# Patient Record
Sex: Female | Born: 1945 | Race: White | Hispanic: No | Marital: Married | Smoking: Never smoker
Health system: Southern US, Community
[De-identification: ages and names within clinical notes are randomized; demographics above are authoritative.]

## PROBLEM LIST (undated history)

## (undated) DIAGNOSIS — E1140 Type 2 diabetes mellitus with diabetic neuropathy, unspecified: Secondary | ICD-10-CM

## (undated) DIAGNOSIS — K5900 Constipation, unspecified: Secondary | ICD-10-CM

## (undated) DIAGNOSIS — R5383 Other fatigue: Secondary | ICD-10-CM

## (undated) DIAGNOSIS — E669 Obesity, unspecified: Secondary | ICD-10-CM

## (undated) DIAGNOSIS — E119 Type 2 diabetes mellitus without complications: Secondary | ICD-10-CM

## (undated) DIAGNOSIS — R635 Abnormal weight gain: Secondary | ICD-10-CM

## (undated) DIAGNOSIS — G8929 Other chronic pain: Secondary | ICD-10-CM

## (undated) DIAGNOSIS — K5792 Diverticulitis of intestine, part unspecified, without perforation or abscess without bleeding: Secondary | ICD-10-CM

## (undated) DIAGNOSIS — F32A Depression, unspecified: Secondary | ICD-10-CM

## (undated) DIAGNOSIS — E785 Hyperlipidemia, unspecified: Secondary | ICD-10-CM

## (undated) DIAGNOSIS — R5381 Other malaise: Secondary | ICD-10-CM

## (undated) DIAGNOSIS — M810 Age-related osteoporosis without current pathological fracture: Secondary | ICD-10-CM

## (undated) DIAGNOSIS — M545 Other chronic pain: Secondary | ICD-10-CM

## (undated) DIAGNOSIS — R609 Edema, unspecified: Secondary | ICD-10-CM

## (undated) DIAGNOSIS — J45909 Unspecified asthma, uncomplicated: Secondary | ICD-10-CM

## (undated) DIAGNOSIS — I10 Essential (primary) hypertension: Secondary | ICD-10-CM

## (undated) DIAGNOSIS — R21 Rash and other nonspecific skin eruption: Secondary | ICD-10-CM

## (undated) DIAGNOSIS — M1990 Unspecified osteoarthritis, unspecified site: Secondary | ICD-10-CM

## (undated) DIAGNOSIS — F419 Anxiety disorder, unspecified: Secondary | ICD-10-CM

## (undated) DIAGNOSIS — E559 Vitamin D deficiency, unspecified: Secondary | ICD-10-CM

## (undated) DIAGNOSIS — Z780 Asymptomatic menopausal state: Secondary | ICD-10-CM

## (undated) DIAGNOSIS — K219 Gastro-esophageal reflux disease without esophagitis: Secondary | ICD-10-CM

## (undated) DIAGNOSIS — M797 Fibromyalgia: Secondary | ICD-10-CM

## (undated) HISTORY — DX: Type 2 diabetes mellitus with diabetic neuropathy, unspecified: E11.40

## (undated) HISTORY — DX: Anxiety disorder, unspecified: F41.9

## (undated) HISTORY — PX: COLONOSCOPY: SHX174

## (undated) HISTORY — DX: Abnormal weight gain: R63.5

## (undated) HISTORY — DX: Gastro-esophageal reflux disease without esophagitis: K21.9

## (undated) HISTORY — DX: Rash and other nonspecific skin eruption: R21

## (undated) HISTORY — DX: Other malaise: R53.83

## (undated) HISTORY — PX: ANKLE FUSION: SHX881

## (undated) HISTORY — DX: Diverticulitis of intestine, part unspecified, without perforation or abscess without bleeding: K57.92

## (undated) HISTORY — DX: Other malaise: R53.81

## (undated) HISTORY — DX: Age-related osteoporosis without current pathological fracture: M81.0

## (undated) HISTORY — PX: CHOLECYSTECTOMY: SHX55

## (undated) HISTORY — DX: Fibromyalgia: M79.7

## (undated) HISTORY — DX: Other chronic pain: G89.29

## (undated) HISTORY — DX: Hyperlipidemia, unspecified: E78.5

## (undated) HISTORY — DX: Edema, unspecified: R60.9

## (undated) HISTORY — DX: Obesity, unspecified: E66.9

## (undated) HISTORY — DX: Other chronic pain: M54.50

## (undated) HISTORY — DX: Unspecified osteoarthritis, unspecified site: M19.90

## (undated) HISTORY — DX: Depression, unspecified: F32.A

## (undated) HISTORY — DX: Asymptomatic menopausal state: Z78.0

## (undated) HISTORY — DX: Vitamin D deficiency, unspecified: E55.9

---

## 2003-01-05 ENCOUNTER — Encounter: Payer: Self-pay | Admitting: Rheumatology

## 2003-01-05 ENCOUNTER — Encounter: Admission: RE | Admit: 2003-01-05 | Discharge: 2003-01-05 | Payer: Self-pay | Admitting: Rheumatology

## 2012-10-13 ENCOUNTER — Other Ambulatory Visit: Payer: Self-pay | Admitting: Orthopaedic Surgery

## 2012-10-13 ENCOUNTER — Ambulatory Visit
Admission: RE | Admit: 2012-10-13 | Discharge: 2012-10-13 | Disposition: A | Discharge status: Home / Self Care | Payer: Medicare Other | Source: Ambulatory Visit | Attending: Orthopaedic Surgery | Admitting: Orthopaedic Surgery

## 2012-10-13 DIAGNOSIS — R609 Edema, unspecified: Secondary | ICD-10-CM

## 2014-12-12 ENCOUNTER — Other Ambulatory Visit: Payer: Self-pay | Admitting: Orthopaedic Surgery

## 2014-12-12 DIAGNOSIS — M545 Low back pain: Secondary | ICD-10-CM

## 2015-01-02 ENCOUNTER — Ambulatory Visit
Admission: RE | Admit: 2015-01-02 | Discharge: 2015-01-02 | Disposition: A | Discharge status: Home / Self Care | Payer: Commercial Managed Care - HMO | Source: Ambulatory Visit | Attending: Orthopaedic Surgery | Admitting: Orthopaedic Surgery

## 2015-01-02 DIAGNOSIS — M545 Low back pain: Secondary | ICD-10-CM

## 2015-10-28 DIAGNOSIS — R1012 Left upper quadrant pain: Secondary | ICD-10-CM | POA: Diagnosis not present

## 2015-10-28 DIAGNOSIS — K5909 Other constipation: Secondary | ICD-10-CM | POA: Diagnosis not present

## 2015-10-31 DIAGNOSIS — R1012 Left upper quadrant pain: Secondary | ICD-10-CM | POA: Diagnosis not present

## 2015-10-31 DIAGNOSIS — Z9049 Acquired absence of other specified parts of digestive tract: Secondary | ICD-10-CM | POA: Diagnosis not present

## 2015-11-03 DIAGNOSIS — N182 Chronic kidney disease, stage 2 (mild): Secondary | ICD-10-CM | POA: Diagnosis not present

## 2015-11-03 DIAGNOSIS — I129 Hypertensive chronic kidney disease with stage 1 through stage 4 chronic kidney disease, or unspecified chronic kidney disease: Secondary | ICD-10-CM | POA: Diagnosis not present

## 2015-11-03 DIAGNOSIS — E782 Mixed hyperlipidemia: Secondary | ICD-10-CM | POA: Diagnosis not present

## 2015-11-03 DIAGNOSIS — Z6841 Body Mass Index (BMI) 40.0 and over, adult: Secondary | ICD-10-CM | POA: Diagnosis not present

## 2015-11-03 DIAGNOSIS — E1169 Type 2 diabetes mellitus with other specified complication: Secondary | ICD-10-CM | POA: Diagnosis not present

## 2015-11-08 DIAGNOSIS — J069 Acute upper respiratory infection, unspecified: Secondary | ICD-10-CM | POA: Diagnosis not present

## 2015-11-13 DIAGNOSIS — M1711 Unilateral primary osteoarthritis, right knee: Secondary | ICD-10-CM | POA: Diagnosis not present

## 2015-11-13 DIAGNOSIS — M25561 Pain in right knee: Secondary | ICD-10-CM | POA: Diagnosis not present

## 2015-11-13 DIAGNOSIS — G8929 Other chronic pain: Secondary | ICD-10-CM | POA: Diagnosis not present

## 2015-11-13 DIAGNOSIS — M1712 Unilateral primary osteoarthritis, left knee: Secondary | ICD-10-CM | POA: Diagnosis not present

## 2015-11-13 DIAGNOSIS — M25562 Pain in left knee: Secondary | ICD-10-CM | POA: Diagnosis not present

## 2015-11-20 DIAGNOSIS — Z6841 Body Mass Index (BMI) 40.0 and over, adult: Secondary | ICD-10-CM | POA: Diagnosis not present

## 2015-11-20 DIAGNOSIS — I129 Hypertensive chronic kidney disease with stage 1 through stage 4 chronic kidney disease, or unspecified chronic kidney disease: Secondary | ICD-10-CM | POA: Diagnosis not present

## 2015-11-20 DIAGNOSIS — L659 Nonscarring hair loss, unspecified: Secondary | ICD-10-CM | POA: Diagnosis not present

## 2015-11-20 DIAGNOSIS — E1165 Type 2 diabetes mellitus with hyperglycemia: Secondary | ICD-10-CM | POA: Diagnosis not present

## 2015-11-20 DIAGNOSIS — E1140 Type 2 diabetes mellitus with diabetic neuropathy, unspecified: Secondary | ICD-10-CM | POA: Diagnosis not present

## 2015-11-20 DIAGNOSIS — N182 Chronic kidney disease, stage 2 (mild): Secondary | ICD-10-CM | POA: Diagnosis not present

## 2015-12-09 DIAGNOSIS — E1140 Type 2 diabetes mellitus with diabetic neuropathy, unspecified: Secondary | ICD-10-CM | POA: Diagnosis not present

## 2015-12-09 DIAGNOSIS — E1165 Type 2 diabetes mellitus with hyperglycemia: Secondary | ICD-10-CM | POA: Diagnosis not present

## 2015-12-09 DIAGNOSIS — Z7189 Other specified counseling: Secondary | ICD-10-CM | POA: Diagnosis not present

## 2015-12-09 DIAGNOSIS — I129 Hypertensive chronic kidney disease with stage 1 through stage 4 chronic kidney disease, or unspecified chronic kidney disease: Secondary | ICD-10-CM | POA: Diagnosis not present

## 2015-12-09 DIAGNOSIS — N182 Chronic kidney disease, stage 2 (mild): Secondary | ICD-10-CM | POA: Diagnosis not present

## 2015-12-25 DIAGNOSIS — M25569 Pain in unspecified knee: Secondary | ICD-10-CM

## 2015-12-25 DIAGNOSIS — M1711 Unilateral primary osteoarthritis, right knee: Secondary | ICD-10-CM | POA: Diagnosis not present

## 2015-12-25 HISTORY — DX: Pain in unspecified knee: M25.569

## 2016-01-13 DIAGNOSIS — I129 Hypertensive chronic kidney disease with stage 1 through stage 4 chronic kidney disease, or unspecified chronic kidney disease: Secondary | ICD-10-CM | POA: Diagnosis not present

## 2016-01-13 DIAGNOSIS — E1169 Type 2 diabetes mellitus with other specified complication: Secondary | ICD-10-CM | POA: Diagnosis not present

## 2016-01-13 DIAGNOSIS — E782 Mixed hyperlipidemia: Secondary | ICD-10-CM | POA: Diagnosis not present

## 2016-01-15 DIAGNOSIS — M797 Fibromyalgia: Secondary | ICD-10-CM | POA: Diagnosis not present

## 2016-01-15 DIAGNOSIS — N182 Chronic kidney disease, stage 2 (mild): Secondary | ICD-10-CM | POA: Diagnosis not present

## 2016-01-15 DIAGNOSIS — I129 Hypertensive chronic kidney disease with stage 1 through stage 4 chronic kidney disease, or unspecified chronic kidney disease: Secondary | ICD-10-CM | POA: Diagnosis not present

## 2016-01-26 DIAGNOSIS — Z9181 History of falling: Secondary | ICD-10-CM | POA: Diagnosis not present

## 2016-01-26 DIAGNOSIS — E1165 Type 2 diabetes mellitus with hyperglycemia: Secondary | ICD-10-CM | POA: Diagnosis not present

## 2016-01-26 DIAGNOSIS — E1140 Type 2 diabetes mellitus with diabetic neuropathy, unspecified: Secondary | ICD-10-CM | POA: Diagnosis not present

## 2016-01-26 DIAGNOSIS — Z139 Encounter for screening, unspecified: Secondary | ICD-10-CM | POA: Diagnosis not present

## 2016-01-26 DIAGNOSIS — Z0000 Encounter for general adult medical examination without abnormal findings: Secondary | ICD-10-CM | POA: Diagnosis not present

## 2016-01-26 DIAGNOSIS — Z1389 Encounter for screening for other disorder: Secondary | ICD-10-CM | POA: Diagnosis not present

## 2016-01-29 DIAGNOSIS — M1711 Unilateral primary osteoarthritis, right knee: Secondary | ICD-10-CM | POA: Diagnosis not present

## 2016-02-01 ENCOUNTER — Other Ambulatory Visit: Payer: Self-pay | Admitting: Orthopedic Surgery

## 2016-02-25 ENCOUNTER — Encounter (HOSPITAL_COMMUNITY): Payer: Self-pay

## 2016-02-25 ENCOUNTER — Ambulatory Visit (HOSPITAL_COMMUNITY)
Admission: RE | Admit: 2016-02-25 | Discharge: 2016-02-25 | Disposition: A | Discharge status: Home / Self Care | Payer: Medicare Other | Source: Ambulatory Visit | Attending: Orthopedic Surgery | Admitting: Orthopedic Surgery

## 2016-02-25 ENCOUNTER — Encounter (HOSPITAL_COMMUNITY)
Admission: RE | Admit: 2016-02-25 | Discharge: 2016-02-25 | Disposition: A | Discharge status: Home / Self Care | Payer: Medicare Other | Source: Ambulatory Visit | Attending: Orthopedic Surgery | Admitting: Orthopedic Surgery

## 2016-02-25 ENCOUNTER — Other Ambulatory Visit: Payer: Self-pay

## 2016-02-25 DIAGNOSIS — Z01812 Encounter for preprocedural laboratory examination: Secondary | ICD-10-CM | POA: Diagnosis not present

## 2016-02-25 DIAGNOSIS — Z01810 Encounter for preprocedural cardiovascular examination: Secondary | ICD-10-CM | POA: Diagnosis not present

## 2016-02-25 DIAGNOSIS — Z01818 Encounter for other preprocedural examination: Secondary | ICD-10-CM

## 2016-02-25 HISTORY — DX: Essential (primary) hypertension: I10

## 2016-02-25 HISTORY — DX: Unspecified asthma, uncomplicated: J45.909

## 2016-02-25 HISTORY — DX: Constipation, unspecified: K59.00

## 2016-02-25 HISTORY — DX: Type 2 diabetes mellitus without complications: E11.9

## 2016-02-25 LAB — CBC WITH DIFFERENTIAL/PLATELET
Basophils Absolute: 0 10*3/uL (ref 0.0–0.1)
Basophils Relative: 0 %
Eosinophils Absolute: 0.2 10*3/uL (ref 0.0–0.7)
Eosinophils Relative: 2 %
HEMATOCRIT: 43.1 % (ref 36.0–46.0)
HEMOGLOBIN: 13.7 g/dL (ref 12.0–15.0)
LYMPHS ABS: 3 10*3/uL (ref 0.7–4.0)
LYMPHS PCT: 32 %
MCH: 26.4 pg (ref 26.0–34.0)
MCHC: 31.8 g/dL (ref 30.0–36.0)
MCV: 83 fL (ref 78.0–100.0)
Monocytes Absolute: 0.6 10*3/uL (ref 0.1–1.0)
Monocytes Relative: 6 %
NEUTROS ABS: 5.6 10*3/uL (ref 1.7–7.7)
Neutrophils Relative %: 60 %
Platelets: 299 10*3/uL (ref 150–400)
RBC: 5.19 MIL/uL — AB (ref 3.87–5.11)
RDW: 14.8 % (ref 11.5–15.5)
WBC: 9.4 10*3/uL (ref 4.0–10.5)

## 2016-02-25 LAB — APTT: APTT: 31 s (ref 24–37)

## 2016-02-25 LAB — URINALYSIS, ROUTINE W REFLEX MICROSCOPIC
Bilirubin Urine: NEGATIVE
Glucose, UA: NEGATIVE mg/dL
KETONES UR: NEGATIVE mg/dL
LEUKOCYTES UA: NEGATIVE
Nitrite: NEGATIVE
PH: 6.5 (ref 5.0–8.0)
Protein, ur: NEGATIVE mg/dL
Specific Gravity, Urine: 1.008 (ref 1.005–1.030)

## 2016-02-25 LAB — COMPREHENSIVE METABOLIC PANEL
ALK PHOS: 65 U/L (ref 38–126)
ALT: 24 U/L (ref 14–54)
AST: 27 U/L (ref 15–41)
Albumin: 3.7 g/dL (ref 3.5–5.0)
Anion gap: 8 (ref 5–15)
BILIRUBIN TOTAL: 0.4 mg/dL (ref 0.3–1.2)
BUN: 12 mg/dL (ref 6–20)
CALCIUM: 9.5 mg/dL (ref 8.9–10.3)
CHLORIDE: 102 mmol/L (ref 101–111)
CO2: 25 mmol/L (ref 22–32)
CREATININE: 0.6 mg/dL (ref 0.44–1.00)
GFR calc Af Amer: >60 mL/min (ref >60)
Glucose, Bld: 193 mg/dL — ABNORMAL HIGH (ref 65–99)
Potassium: 4 mmol/L (ref 3.5–5.1)
Sodium: 135 mmol/L (ref 135–145)
TOTAL PROTEIN: 6.9 g/dL (ref 6.5–8.1)

## 2016-02-25 LAB — PROTIME-INR
INR: 0.98 (ref 0.00–1.49)
PROTHROMBIN TIME: 13.2 s (ref 11.6–15.2)

## 2016-02-25 LAB — URINE MICROSCOPIC-ADD ON

## 2016-02-25 LAB — GLUCOSE, CAPILLARY: Glucose-Capillary: 179 mg/dL — ABNORMAL HIGH (ref 65–99)

## 2016-02-25 LAB — SURGICAL PCR SCREEN
MRSA, PCR: NEGATIVE
Staphylococcus aureus: NEGATIVE

## 2016-02-25 NOTE — Progress Notes (Signed)
PCP is Marco Collie  Patient denied having any acute cardiac or pulmonary issues  Patient informed Nurse that she does not check her blood glucose levels at home. CBG on arrival to PAT was 179, and patient stated she consumed three pancakes and drank two or three cups of coffee.  Patient stated she last used her Albuterol inhaler last week.

## 2016-02-25 NOTE — Pre-Procedure Instructions (Signed)
Tylar Ollivierre  02/25/2016     Your procedure is scheduled on : Monday March 08, 2016 at 10:00 AM.  Report to [facility] Admitting at 8:00 AM.  Call this number if you have problems the morning of surgery: [phone]    Remember:  Do not eat food or drink liquids after midnight.  Take these medicines the morning of surgery with A SIP OF WATER : Albuterol inhaler if needed (bring inhaler with you), Omeprazole (Prilosec), and Tramadol (Ultram) if needed   Stop taking any vitamins, herbal medications/supplements, NSAIDs, Ibuprofen, Advil, Motrin, Aleve etc on Monday May 29th   Do NOT take any diabetic medications the morning of your surgery (NO Metformin/Glucophage)    How to Manage Your Diabetes Before and After Surgery  Why is it important to control my blood sugar before and after surgery? . Improving blood sugar levels before and after surgery helps healing and can limit problems. . A way of improving blood sugar control is eating a healthy diet by: o  Eating less sugar and carbohydrates o  Increasing activity/exercise o  Talking with your doctor about reaching your blood sugar goals . High blood sugars (greater than 180 mg/dL) can raise your risk of infections and slow your recovery, so you will need to focus on controlling your diabetes during the weeks before surgery. . Make sure that the doctor who takes care of your diabetes knows about your planned surgery including the date and location.  How do I manage my blood sugar before surgery? . Check your blood sugar at least 4 times a day, starting 2 days before surgery, to make sure that the level is not too high or low. o Check your blood sugar the morning of your surgery when you wake up and every 2 hours until you get to the Short Stay unit. . If your blood sugar is less than 70 mg/dL, you will need to treat for low blood sugar: o Do not take insulin. o Treat a low blood sugar (less than 70 mg/dL) with  cup of  clear juice (cranberry or apple), 4 glucose tablets, OR glucose gel. o Recheck blood sugar in 15 minutes after treatment (to make sure it is greater than 70 mg/dL). If your blood sugar is not greater than 70 mg/dL on recheck, call [phone] for further instructions. . Report your blood sugar to the short stay nurse when you get to Short Stay.  . If you are admitted to the hospital after surgery: o Your blood sugar will be checked by the staff and you will probably be given insulin after surgery (instead of oral diabetes medicines) to make sure you have good blood sugar levels. o The goal for blood sugar control after surgery is 80-180 mg/dL.     WHAT DO I DO ABOUT MY DIABETES MEDICATION?  Marland Kitchen Do not take oral diabetes medicines (pills) the morning of surgery.  Reviewed and Endorsed by [facility] Patient Education Committee, August 2015   Do not wear jewelry, make-up or nail polish.  Do not wear lotions, powders, or perfumes.    Do not shave 48 hours prior to surgery.   Do not bring valuables to the hospital.  [facility] is not responsible for any belongings or valuables.  Contacts, dentures or bridgework may not be worn into surgery.  Leave your suitcase in the car.  After surgery it may be brought to your room.  For patients admitted to the hospital, discharge time will be  determined by your treatment team.  Patients discharged the day of surgery will not be allowed to drive home.   Name and phone number of your driver:    Special instructions:  Shower using CHG soap the night before and the morning of your surgery  Please read over the following fact sheets that you were given. Pain Booklet, MRSA Information and Surgical Site Infection Prevention

## 2016-02-26 LAB — HEMOGLOBIN A1C
Hgb A1c MFr Bld: 6.5 % — ABNORMAL HIGH (ref 4.8–5.6)
Mean Plasma Glucose: 140 mg/dL

## 2016-02-26 LAB — URINE CULTURE

## 2016-03-05 MED ORDER — SODIUM CHLORIDE 0.9 % IV SOLN: INTRAVENOUS | Status: DC

## 2016-03-05 MED ORDER — CEFAZOLIN SODIUM-DEXTROSE 2-4 GM/100ML-% IV SOLN
2.0000 g | INTRAVENOUS | Status: AC
  Administered 2016-03-08: 2 g via INTRAVENOUS
  Filled 2016-03-05: qty 100

## 2016-03-05 MED ORDER — TRANEXAMIC ACID 1000 MG/10ML IV SOLN
1000.0000 mg | INTRAVENOUS | Status: AC
  Administered 2016-03-08: 1000 mg via INTRAVENOUS
  Filled 2016-03-05: qty 10

## 2016-03-05 MED ORDER — ACETAMINOPHEN 500 MG PO TABS
1000.0000 mg | ORAL_TABLET | Freq: Once | ORAL | Status: AC
  Administered 2016-03-08: 1000 mg via ORAL
  Filled 2016-03-05: qty 2

## 2016-03-07 NOTE — Anesthesia Preprocedure Evaluation (Addendum)
Anesthesia Evaluation  Patient identified by MRN, date of birth, ID band Patient awake    Reviewed: Allergy & Precautions, H&P , NPO status , Patient's Chart, lab work & pertinent test results  Airway Mallampati: III  TM Distance: >3 FB Neck ROM: Full    Dental no notable dental hx. (+) Teeth Intact, Dental Advisory Given   Pulmonary asthma ,    Pulmonary exam normal breath sounds clear to auscultation       Cardiovascular hypertension, Pt. on medications  Rhythm:Regular Rate:Normal     Neuro/Psych negative neurological ROS  negative psych ROS   GI/Hepatic negative GI ROS, Neg liver ROS,   Endo/Other  diabetes, Type 2, Oral Hypoglycemic AgentsMorbid obesity  Renal/GU negative Renal ROS  negative genitourinary   Musculoskeletal   Abdominal   Peds  Hematology negative hematology ROS (+)   Anesthesia Other Findings   Reproductive/Obstetrics negative OB ROS                           Anesthesia Physical Anesthesia Plan  ASA: III  Anesthesia Plan: MAC and Spinal   Post-op Pain Management:    Induction: Intravenous  Airway Management Planned: Simple Face Mask  Additional Equipment:   Intra-op Plan:   Post-operative Plan:   Informed Consent: I have reviewed the patients History and Physical, chart, labs and discussed the procedure including the risks, benefits and alternatives for the proposed anesthesia with the patient or authorized representative who has indicated his/her understanding and acceptance.   Dental advisory given  Plan Discussed with: CRNA  Anesthesia Plan Comments:         Anesthesia Quick Evaluation

## 2016-03-08 ENCOUNTER — Encounter (HOSPITAL_COMMUNITY)
Admission: RE | Disposition: A | Discharge status: Home Health | Payer: Self-pay | Source: Ambulatory Visit | Attending: Orthopedic Surgery

## 2016-03-08 ENCOUNTER — Inpatient Hospital Stay (HOSPITAL_COMMUNITY): Payer: Medicare Other | Admitting: Anesthesiology

## 2016-03-08 ENCOUNTER — Encounter (HOSPITAL_COMMUNITY): Payer: Self-pay | Admitting: *Deleted

## 2016-03-08 ENCOUNTER — Inpatient Hospital Stay (HOSPITAL_COMMUNITY)
Admission: RE | Admit: 2016-03-08 | Discharge: 2016-03-10 | DRG: 470 | Disposition: A | Discharge status: Home Health | Payer: Medicare Other | Source: Ambulatory Visit | Attending: Orthopedic Surgery | Admitting: Orthopedic Surgery

## 2016-03-08 DIAGNOSIS — Z96651 Presence of right artificial knee joint: Secondary | ICD-10-CM | POA: Diagnosis not present

## 2016-03-08 DIAGNOSIS — E119 Type 2 diabetes mellitus without complications: Secondary | ICD-10-CM | POA: Diagnosis not present

## 2016-03-08 DIAGNOSIS — I10 Essential (primary) hypertension: Secondary | ICD-10-CM | POA: Diagnosis not present

## 2016-03-08 DIAGNOSIS — M1711 Unilateral primary osteoarthritis, right knee: Principal | ICD-10-CM | POA: Diagnosis present

## 2016-03-08 DIAGNOSIS — Z885 Allergy status to narcotic agent status: Secondary | ICD-10-CM

## 2016-03-08 DIAGNOSIS — D62 Acute posthemorrhagic anemia: Secondary | ICD-10-CM | POA: Diagnosis not present

## 2016-03-08 DIAGNOSIS — Z981 Arthrodesis status: Secondary | ICD-10-CM | POA: Diagnosis not present

## 2016-03-08 DIAGNOSIS — M25561 Pain in right knee: Secondary | ICD-10-CM | POA: Diagnosis present

## 2016-03-08 DIAGNOSIS — M179 Osteoarthritis of knee, unspecified: Secondary | ICD-10-CM | POA: Diagnosis not present

## 2016-03-08 DIAGNOSIS — Z96659 Presence of unspecified artificial knee joint: Secondary | ICD-10-CM

## 2016-03-08 HISTORY — DX: Presence of unspecified artificial knee joint: Z96.659

## 2016-03-08 HISTORY — PX: TOTAL KNEE ARTHROPLASTY: SHX125

## 2016-03-08 LAB — CBC
HEMATOCRIT: 40.8 % (ref 36.0–46.0)
Hemoglobin: 13 g/dL (ref 12.0–15.0)
MCH: 26.3 pg (ref 26.0–34.0)
MCHC: 31.9 g/dL (ref 30.0–36.0)
MCV: 82.6 fL (ref 78.0–100.0)
Platelets: 310 10*3/uL (ref 150–400)
RBC: 4.94 MIL/uL (ref 3.87–5.11)
RDW: 14.7 % (ref 11.5–15.5)
WBC: 9.5 10*3/uL (ref 4.0–10.5)

## 2016-03-08 LAB — GLUCOSE, CAPILLARY
GLUCOSE-CAPILLARY: 146 mg/dL — AB (ref 65–99)
Glucose-Capillary: 121 mg/dL — ABNORMAL HIGH (ref 65–99)
Glucose-Capillary: 174 mg/dL — ABNORMAL HIGH (ref 65–99)
Glucose-Capillary: 203 mg/dL — ABNORMAL HIGH (ref 65–99)

## 2016-03-08 LAB — CREATININE, SERUM
Creatinine, Ser: 0.68 mg/dL (ref 0.44–1.00)
GFR calc non Af Amer: >60 mL/min (ref >60)

## 2016-03-08 SURGERY — ARTHROPLASTY, KNEE, TOTAL
Anesthesia: Monitor Anesthesia Care | Site: Knee | Laterality: Right

## 2016-03-08 MED ORDER — ALUM & MAG HYDROXIDE-SIMETH 200-200-20 MG/5ML PO SUSP: 30.0000 mL | ORAL | Status: DC | PRN

## 2016-03-08 MED ORDER — ONDANSETRON HCL 4 MG/2ML IJ SOLN: 4.0000 mg | Freq: Four times a day (QID) | INTRAMUSCULAR | Status: DC | PRN

## 2016-03-08 MED ORDER — DEXAMETHASONE SODIUM PHOSPHATE 10 MG/ML IJ SOLN
INTRAMUSCULAR | Status: DC | PRN
  Administered 2016-03-08: 10 mg via INTRAVENOUS

## 2016-03-08 MED ORDER — MENTHOL 3 MG MT LOZG: 1.0000 | LOZENGE | OROMUCOSAL | Status: DC | PRN

## 2016-03-08 MED ORDER — BUPIVACAINE LIPOSOME 1.3 % IJ SUSP
20.0000 mL | INTRAMUSCULAR | Status: AC
  Administered 2016-03-08: 20 mL
  Filled 2016-03-08: qty 20

## 2016-03-08 MED ORDER — SENNOSIDES-DOCUSATE SODIUM 8.6-50 MG PO TABS: 1.0000 | ORAL_TABLET | Freq: Every evening | ORAL | Status: DC | PRN

## 2016-03-08 MED ORDER — ROSUVASTATIN CALCIUM 10 MG PO TABS
10.0000 mg | ORAL_TABLET | Freq: Every evening | ORAL | Status: DC
  Administered 2016-03-08 – 2016-03-09 (×2): 10 mg via ORAL
  Filled 2016-03-08 (×2): qty 1

## 2016-03-08 MED ORDER — ZOLPIDEM TARTRATE 5 MG PO TABS: 5.0000 mg | ORAL_TABLET | Freq: Every evening | ORAL | Status: DC | PRN

## 2016-03-08 MED ORDER — PROPOFOL 500 MG/50ML IV EMUL
INTRAVENOUS | Status: DC | PRN
  Administered 2016-03-08: 60 ug/kg/min via INTRAVENOUS
  Administered 2016-03-08: 11:00:00 via INTRAVENOUS

## 2016-03-08 MED ORDER — INSULIN ASPART 100 UNIT/ML ~~LOC~~ SOLN
0.0000 [IU] | Freq: Every day | SUBCUTANEOUS | Status: DC
  Administered 2016-03-08: 2 [IU] via SUBCUTANEOUS

## 2016-03-08 MED ORDER — HYDROMORPHONE HCL 1 MG/ML IJ SOLN
INTRAMUSCULAR | Status: AC
Start: 2016-03-08 — End: 2016-03-09
  Filled 2016-03-08: qty 1

## 2016-03-08 MED ORDER — OXYCODONE HCL ER 10 MG PO T12A
10.0000 mg | EXTENDED_RELEASE_TABLET | Freq: Two times a day (BID) | ORAL | Status: DC
  Administered 2016-03-08 – 2016-03-10 (×4): 10 mg via ORAL
  Filled 2016-03-08 (×2): qty 1

## 2016-03-08 MED ORDER — PHENOL 1.4 % MT LIQD: 1.0000 | OROMUCOSAL | Status: DC | PRN

## 2016-03-08 MED ORDER — FLEET ENEMA 7-19 GM/118ML RE ENEM: 1.0000 | ENEMA | Freq: Once | RECTAL | Status: DC | PRN

## 2016-03-08 MED ORDER — BISACODYL 5 MG PO TBEC: 5.0000 mg | DELAYED_RELEASE_TABLET | Freq: Every day | ORAL | Status: DC | PRN

## 2016-03-08 MED ORDER — LACTATED RINGERS IV SOLN
INTRAVENOUS | Status: DC
  Administered 2016-03-08: 09:00:00 via INTRAVENOUS

## 2016-03-08 MED ORDER — ONDANSETRON HCL 4 MG/2ML IJ SOLN
INTRAMUSCULAR | Status: AC
  Filled 2016-03-08: qty 2

## 2016-03-08 MED ORDER — METHOCARBAMOL 500 MG PO TABS
500.0000 mg | ORAL_TABLET | Freq: Four times a day (QID) | ORAL | Status: DC | PRN
  Administered 2016-03-08 – 2016-03-09 (×3): 500 mg via ORAL
  Filled 2016-03-08 (×3): qty 1

## 2016-03-08 MED ORDER — ONDANSETRON HCL 4 MG PO TABS: 4.0000 mg | ORAL_TABLET | Freq: Four times a day (QID) | ORAL | Status: DC | PRN

## 2016-03-08 MED ORDER — ACETAMINOPHEN 650 MG RE SUPP: 650.0000 mg | Freq: Four times a day (QID) | RECTAL | Status: DC | PRN

## 2016-03-08 MED ORDER — SODIUM CHLORIDE 0.9 % IJ SOLN
INTRAMUSCULAR | Status: DC | PRN
  Administered 2016-03-08: 20 mL via INTRAVENOUS

## 2016-03-08 MED ORDER — METOCLOPRAMIDE HCL 5 MG PO TABS: 5.0000 mg | ORAL_TABLET | Freq: Three times a day (TID) | ORAL | Status: DC | PRN

## 2016-03-08 MED ORDER — DEXAMETHASONE SODIUM PHOSPHATE 10 MG/ML IJ SOLN
INTRAMUSCULAR | Status: AC
  Filled 2016-03-08: qty 1

## 2016-03-08 MED ORDER — BENAZEPRIL HCL 20 MG PO TABS
40.0000 mg | ORAL_TABLET | Freq: Every day | ORAL | Status: DC
  Administered 2016-03-08 – 2016-03-09 (×2): 40 mg via ORAL
  Filled 2016-03-08 (×3): qty 2

## 2016-03-08 MED ORDER — INSULIN ASPART 100 UNIT/ML ~~LOC~~ SOLN
0.0000 [IU] | Freq: Three times a day (TID) | SUBCUTANEOUS | Status: DC
  Administered 2016-03-08: 3 [IU] via SUBCUTANEOUS
  Administered 2016-03-09 – 2016-03-10 (×4): 2 [IU] via SUBCUTANEOUS

## 2016-03-08 MED ORDER — HYDROMORPHONE HCL 1 MG/ML IJ SOLN
1.0000 mg | INTRAMUSCULAR | Status: DC | PRN
  Administered 2016-03-08 – 2016-03-09 (×3): 1 mg via INTRAVENOUS
  Filled 2016-03-08 (×3): qty 1

## 2016-03-08 MED ORDER — BUPIVACAINE-EPINEPHRINE (PF) 0.25% -1:200000 IJ SOLN
INTRAMUSCULAR | Status: AC
  Filled 2016-03-08: qty 30

## 2016-03-08 MED ORDER — CHLORHEXIDINE GLUCONATE 4 % EX LIQD: 60.0000 mL | Freq: Once | CUTANEOUS | Status: DC

## 2016-03-08 MED ORDER — SODIUM CHLORIDE 0.9 % IR SOLN
Status: DC | PRN
  Administered 2016-03-08: 1000 mL

## 2016-03-08 MED ORDER — SODIUM CHLORIDE 0.9 % IV SOLN
INTRAVENOUS | Status: DC
  Administered 2016-03-08 – 2016-03-09 (×3): via INTRAVENOUS

## 2016-03-08 MED ORDER — ACETAMINOPHEN 325 MG PO TABS: 650.0000 mg | ORAL_TABLET | Freq: Four times a day (QID) | ORAL | Status: DC | PRN

## 2016-03-08 MED ORDER — PANTOPRAZOLE SODIUM 40 MG PO TBEC
80.0000 mg | DELAYED_RELEASE_TABLET | Freq: Every day | ORAL | Status: DC
  Administered 2016-03-09 – 2016-03-10 (×2): 80 mg via ORAL
  Filled 2016-03-08 (×2): qty 2

## 2016-03-08 MED ORDER — CEFAZOLIN SODIUM 1-5 GM-% IV SOLN
1.0000 g | Freq: Four times a day (QID) | INTRAVENOUS | Status: AC
Start: 2016-03-08 — End: 2016-03-08
  Administered 2016-03-08 (×2): 1 g via INTRAVENOUS
  Filled 2016-03-08 (×2): qty 50

## 2016-03-08 MED ORDER — BUPIVACAINE IN DEXTROSE 0.75-8.25 % IT SOLN
INTRATHECAL | Status: DC | PRN
  Administered 2016-03-08: 15 mg via INTRATHECAL

## 2016-03-08 MED ORDER — OXYCODONE HCL 5 MG PO TABS
5.0000 mg | ORAL_TABLET | ORAL | Status: DC | PRN
  Administered 2016-03-08 – 2016-03-10 (×11): 10 mg via ORAL
  Filled 2016-03-08 (×11): qty 2

## 2016-03-08 MED ORDER — METFORMIN HCL ER 500 MG PO TB24
500.0000 mg | ORAL_TABLET | Freq: Every day | ORAL | Status: DC
  Administered 2016-03-09 – 2016-03-10 (×2): 500 mg via ORAL
  Filled 2016-03-08: qty 1

## 2016-03-08 MED ORDER — MIDAZOLAM HCL 2 MG/2ML IJ SOLN
INTRAMUSCULAR | Status: AC
  Filled 2016-03-08: qty 2

## 2016-03-08 MED ORDER — HYDROMORPHONE HCL 1 MG/ML IJ SOLN
0.2500 mg | INTRAMUSCULAR | Status: DC | PRN
  Administered 2016-03-08 (×4): 0.5 mg via INTRAVENOUS

## 2016-03-08 MED ORDER — ONDANSETRON HCL 4 MG/2ML IJ SOLN
INTRAMUSCULAR | Status: DC | PRN
  Administered 2016-03-08: 4 mg via INTRAVENOUS

## 2016-03-08 MED ORDER — METHOCARBAMOL 1000 MG/10ML IJ SOLN
500.0000 mg | Freq: Four times a day (QID) | INTRAVENOUS | Status: DC | PRN
  Filled 2016-03-08: qty 5

## 2016-03-08 MED ORDER — HYDROMORPHONE HCL 1 MG/ML IJ SOLN
INTRAMUSCULAR | Status: AC
  Filled 2016-03-08: qty 1

## 2016-03-08 MED ORDER — BUPIVACAINE-EPINEPHRINE (PF) 0.25% -1:200000 IJ SOLN
INTRAMUSCULAR | Status: DC | PRN
Start: 2016-03-08 — End: 2016-03-08
  Administered 2016-03-08: 30 mL via PERINEURAL

## 2016-03-08 MED ORDER — ENOXAPARIN SODIUM 30 MG/0.3ML ~~LOC~~ SOLN
30.0000 mg | Freq: Two times a day (BID) | SUBCUTANEOUS | Status: DC
  Administered 2016-03-09 – 2016-03-10 (×3): 30 mg via SUBCUTANEOUS
  Filled 2016-03-08 (×4): qty 0.3

## 2016-03-08 MED ORDER — 0.9 % SODIUM CHLORIDE (POUR BTL) OPTIME
TOPICAL | Status: DC | PRN
  Administered 2016-03-08: 1000 mL

## 2016-03-08 MED ORDER — PHENYLEPHRINE HCL 10 MG/ML IJ SOLN
10.0000 mg | INTRAVENOUS | Status: DC | PRN
  Administered 2016-03-08: 20 ug/min via INTRAVENOUS

## 2016-03-08 MED ORDER — ALBUTEROL SULFATE (2.5 MG/3ML) 0.083% IN NEBU: 3.0000 mL | INHALATION_SOLUTION | Freq: Four times a day (QID) | RESPIRATORY_TRACT | Status: DC | PRN

## 2016-03-08 MED ORDER — SODIUM CHLORIDE 0.9 % IV SOLN
1000.0000 mg | Freq: Once | INTRAVENOUS | Status: AC
  Administered 2016-03-08: 1000 mg via INTRAVENOUS
  Filled 2016-03-08: qty 10

## 2016-03-08 MED ORDER — FENTANYL CITRATE (PF) 250 MCG/5ML IJ SOLN
INTRAMUSCULAR | Status: AC
  Filled 2016-03-08: qty 5

## 2016-03-08 MED ORDER — METOCLOPRAMIDE HCL 5 MG/ML IJ SOLN: 5.0000 mg | Freq: Three times a day (TID) | INTRAMUSCULAR | Status: DC | PRN

## 2016-03-08 MED ORDER — DIPHENHYDRAMINE HCL 12.5 MG/5ML PO ELIX: 12.5000 mg | ORAL_SOLUTION | ORAL | Status: DC | PRN

## 2016-03-08 MED ORDER — DOCUSATE SODIUM 100 MG PO CAPS
100.0000 mg | ORAL_CAPSULE | Freq: Two times a day (BID) | ORAL | Status: DC
  Administered 2016-03-08 – 2016-03-10 (×4): 100 mg via ORAL
  Filled 2016-03-08 (×4): qty 1

## 2016-03-08 MED ORDER — CELECOXIB 200 MG PO CAPS
200.0000 mg | ORAL_CAPSULE | Freq: Two times a day (BID) | ORAL | Status: DC
  Administered 2016-03-09 – 2016-03-10 (×3): 200 mg via ORAL
  Filled 2016-03-08 (×3): qty 1

## 2016-03-08 MED ORDER — MIDAZOLAM HCL 5 MG/5ML IJ SOLN
INTRAMUSCULAR | Status: DC | PRN
  Administered 2016-03-08 (×2): 1 mg via INTRAVENOUS

## 2016-03-08 SURGICAL SUPPLY — 59 items
BANDAGE ESMARK 6X9 LF (GAUZE/BANDAGES/DRESSINGS) ×1 IMPLANT
BLADE SAGITTAL 13X1.27X60 (BLADE) ×2 IMPLANT
BLADE SAGITTAL 13X1.27X60MM (BLADE) ×1
BLADE SAW SGTL 83.5X18.5 (BLADE) ×3 IMPLANT
BLADE SURG 10 STRL SS (BLADE) ×3 IMPLANT
BNDG ESMARK 6X9 LF (GAUZE/BANDAGES/DRESSINGS) ×3
BOWL SMART MIX CTS (DISPOSABLE) ×3 IMPLANT
CAPT KNEE TOTAL 3 ×3 IMPLANT
CEMENT BONE SIMPLEX SPEEDSET (Cement) ×6 IMPLANT
COVER SURGICAL LIGHT HANDLE (MISCELLANEOUS) ×3 IMPLANT
CUFF TOURNIQUET SINGLE 34IN LL (TOURNIQUET CUFF) ×3 IMPLANT
DRAPE EXTREMITY T 121X128X90 (DRAPE) ×3 IMPLANT
DRAPE INCISE IOBAN 66X45 STRL (DRAPES) ×6 IMPLANT
DRAPE PROXIMA HALF (DRAPES) IMPLANT
DRAPE U-SHAPE 47X51 STRL (DRAPES) ×3 IMPLANT
DRSG ADAPTIC 3X8 NADH LF (GAUZE/BANDAGES/DRESSINGS) ×3 IMPLANT
DRSG AQUACEL AG ADV 3.5X10 (GAUZE/BANDAGES/DRESSINGS) ×3 IMPLANT
DRSG PAD ABDOMINAL 8X10 ST (GAUZE/BANDAGES/DRESSINGS) ×3 IMPLANT
DURAPREP 26ML APPLICATOR (WOUND CARE) ×3 IMPLANT
ELECT REM PT RETURN 9FT ADLT (ELECTROSURGICAL) ×3
ELECTRODE REM PT RTRN 9FT ADLT (ELECTROSURGICAL) ×1 IMPLANT
GAUZE SPONGE 4X4 12PLY STRL (GAUZE/BANDAGES/DRESSINGS) ×3 IMPLANT
GLOVE BIOGEL M 7.0 STRL (GLOVE) IMPLANT
GLOVE BIOGEL PI IND STRL 7.5 (GLOVE) IMPLANT
GLOVE BIOGEL PI IND STRL 8.5 (GLOVE) ×5 IMPLANT
GLOVE BIOGEL PI INDICATOR 7.5 (GLOVE)
GLOVE BIOGEL PI INDICATOR 8.5 (GLOVE) ×10
GLOVE SURG ORTHO 8.0 STRL STRW (GLOVE) ×18 IMPLANT
GOWN STRL REUS W/ TWL LRG LVL3 (GOWN DISPOSABLE) ×1 IMPLANT
GOWN STRL REUS W/ TWL XL LVL3 (GOWN DISPOSABLE) ×2 IMPLANT
GOWN STRL REUS W/TWL 2XL LVL3 (GOWN DISPOSABLE) ×3 IMPLANT
GOWN STRL REUS W/TWL LRG LVL3 (GOWN DISPOSABLE) ×2
GOWN STRL REUS W/TWL XL LVL3 (GOWN DISPOSABLE) ×4
HANDPIECE INTERPULSE COAX TIP (DISPOSABLE) ×2
HOOD PEEL AWAY FACE SHEILD DIS (HOOD) ×12 IMPLANT
KIT BASIN OR (CUSTOM PROCEDURE TRAY) ×3 IMPLANT
KIT ROOM TURNOVER OR (KITS) ×3 IMPLANT
KNEE CAPITATED TOTAL 3 ×1 IMPLANT
MANIFOLD NEPTUNE II (INSTRUMENTS) ×3 IMPLANT
NEEDLE 22X1 1/2 (OR ONLY) (NEEDLE) ×6 IMPLANT
NS IRRIG 1000ML POUR BTL (IV SOLUTION) ×3 IMPLANT
PACK TOTAL JOINT (CUSTOM PROCEDURE TRAY) ×3 IMPLANT
PACK UNIVERSAL I (CUSTOM PROCEDURE TRAY) ×3 IMPLANT
PAD ARMBOARD 7.5X6 YLW CONV (MISCELLANEOUS) ×6 IMPLANT
PADDING CAST COTTON 6X4 STRL (CAST SUPPLIES) ×3 IMPLANT
SET HNDPC FAN SPRY TIP SCT (DISPOSABLE) ×1 IMPLANT
STAPLER VISISTAT 35W (STAPLE) IMPLANT
SUCTION FRAZIER HANDLE 10FR (MISCELLANEOUS) ×2
SUCTION TUBE FRAZIER 10FR DISP (MISCELLANEOUS) ×1 IMPLANT
SUT BONE WAX W31G (SUTURE) ×3 IMPLANT
SUT VIC AB 0 CTB1 27 (SUTURE) ×6 IMPLANT
SUT VIC AB 1 CT1 27 (SUTURE) ×4
SUT VIC AB 1 CT1 27XBRD ANBCTR (SUTURE) ×2 IMPLANT
SUT VIC AB 2-0 CT1 27 (SUTURE) ×4
SUT VIC AB 2-0 CT1 TAPERPNT 27 (SUTURE) ×2 IMPLANT
SYR 20CC LL (SYRINGE) ×6 IMPLANT
TOWEL OR 17X24 6PK STRL BLUE (TOWEL DISPOSABLE) ×3 IMPLANT
TOWEL OR 17X26 10 PK STRL BLUE (TOWEL DISPOSABLE) ×3 IMPLANT
WATER STERILE IRR 1000ML POUR (IV SOLUTION) IMPLANT

## 2016-03-08 NOTE — Anesthesia Postprocedure Evaluation (Signed)
Anesthesia Post Note  Patient: Kristin Flores  Procedure(s) Performed: Procedure(s) (LRB): TOTAL KNEE ARTHROPLASTY (Right)  Patient location during evaluation: PACU Anesthesia Type: Spinal and MAC Level of consciousness: awake and alert Pain management: pain level controlled Vital Signs Assessment: post-procedure vital signs reviewed and stable Respiratory status: spontaneous breathing and respiratory function stable Cardiovascular status: blood pressure returned to baseline and stable Postop Assessment: spinal receding Anesthetic complications: no    Last Vitals:  Filed Vitals:   03/08/16 1222 03/08/16 1230  BP: 127/65 132/74  Pulse: 71 73  Temp:  36.6 C  Resp: 15 17    Last Pain:  Filed Vitals:   03/08/16 1310  PainSc: 7                  Annalis Kaczmarczyk,W. EDMOND

## 2016-03-08 NOTE — Op Note (Signed)
TOTAL KNEE REPLACEMENT OPERATIVE NOTE:  03/08/2016  3:53 PM  PATIENT:  Kristin Flores  70 y.o. female  PRE-OPERATIVE DIAGNOSIS:  primary osteoarthritis right knee  POST-OPERATIVE DIAGNOSIS:  primary osteoarthritis right knee  PROCEDURE:  Procedure(s): TOTAL KNEE ARTHROPLASTY  SURGEON:  Surgeon(s): Vickey Huger, MD  PHYSICIAN ASSISTANT: Carlyon Shadow, [facility]   ANESTHESIA:   spinal  DRAINS: Hemovac  SPECIMEN: None  COUNTS:  Correct  TOURNIQUET:   Total Tourniquet Time Documented: Thigh (Right) - 44 minutes Total: Thigh (Right) - 44 minutes   DICTATION:  Indication for procedure:    The patient is a 70 y.o. female who has failed conservative treatment for primary osteoarthritis right knee.  Informed consent was obtained prior to anesthesia. The risks versus benefits of the operation were explain and in a way the patient can, and did, understand.   On the implant demand matching protocol, this patient scored 8.  Therefore, this patient did" "did not receive a polyethylene insert with vitamin E which is a high demand implant.  Description of procedure:     The patient was taken to the operating room and placed under anesthesia.  The patient was positioned in the usual fashion taking care that all body parts were adequately padded and/or protected.  I foley catheter was not placed.  A tourniquet was applied and the leg prepped and draped in the usual sterile fashion.  The extremity was exsanguinated with the esmarch and tourniquet inflated to 350 mmHg.  Pre-operative range of motion was normal.  The knee was in 5 degree of mild varus.  A midline incision approximately 6-7 inches long was made with a #10 blade.  A new blade was used to make a parapatellar arthrotomy going 2-3 cm into the quadriceps tendon, over the patella, and alongside the medial aspect of the patellar tendon.  A synovectomy was then performed with the #10 blade and forceps. I then elevated the deep MCL off the  medial tibial metaphysis subperiosteally around to the semimembranosus attachment.    I everted the patella and used calipers to measure patellar thickness.  I used the reamer to ream down to appropriate thickness to recreate the native thickness.  I then removed excess bone with the rongeur and sagittal saw.  I used the appropriately sized template and drilled the three lug holes.  I then put the trial in place and measured the thickness with the calipers to ensure recreation of the native thickness.  The trial was then removed and the patella subluxed and the knee brought into flexion.  A homan retractor was place to retract and protect the patella and lateral structures.  A Z-retractor was place medially to protect the medial structures.  The extra-medullary alignment system was used to make cut the tibial articular surface perpendicular to the anamotic axis of the tibia and in 3 degrees of posterior slope.  The cut surface and alignment jig was removed.  I then used the intramedullary alignment guide to make a 6 valgus cut on the distal femur.  I then marked out the epicondylar axis on the distal femur.  The posterior condylar axis measured 3 degrees.  I then used the anterior referencing sizer and measured the femur to be a size 8.  The 4-In-1 cutting block was screwed into place in external rotation matching the posterior condylar angle, making our cuts perpendicular to the epicondylar axis.  Anterior, posterior and chamfer cuts were made with the sagittal saw.  The cutting block and cut pieces  were removed.  A lamina spreader was placed in 90 degrees of flexion.  The ACL, PCL, menisci, and posterior condylar osteophytes were removed.  A 11 mm spacer blocked was found to offer good flexion and extension gap balance after minimal in degree releasing.   The scoop retractor was then placed and the femoral finishing block was pinned in place.  The small sagittal saw was used as well as the lug drill to  finish the femur.  The block and cut surfaces were removed and the medullary canal hole filled with autograft bone from the cut pieces.  The tibia was delivered forward in deep flexion and external rotation.  A size D tray was selected and pinned into place centered on the medial 1/3 of the tibial tubercle.  The reamer and keel was used to prepare the tibia through the tray.    I then trialed with the size 8 femur, size D tibia, a 11 mm insert and the 32 patella.  I had excellent flexion/extension gap balance, excellent patella tracking.  Flexion was full and beyond 120 degrees; extension was zero.  These components were chosen and the staff opened them to me on the back table while the knee was lavaged copiously and the cement mixed.  The soft tissue was infiltrated with 60cc of exparel 1.3% through a 21 gauge needle.  I cemented in the components and removed all excess cement.  The polyethylene tibial component was snapped into place and the knee placed in extension while cement was hardening.  The capsule was infilltrated with 30cc of .25% Marcaine with epinephrine.  A hemovac was place in the joint exiting superolaterally.  A pain pump was place superomedially superficial to the arthrotomy.  Once the cement was hard, the tourniquet was let down.  Hemostasis was obtained.  The arthrotomy was closed with figure-8 #1 vicryl sutures.  The deep soft tissues were closed with #0 vicryls and the subcuticular layer closed with a running #2-0 vicryl.  The skin was reapproximated and closed with skin staples.  The wound was dressed with xeroform, 4 x4's, 2 ABD sponges, a single layer of webril and a TED stocking.   The patient was then awakened, extubated, and taken to the recovery room in stable condition.  BLOOD LOSS:  300cc DRAINS: 1 hemovac, 1 pain catheter COMPLICATIONS:  None.  PLAN OF CARE: Admit to inpatient   PATIENT DISPOSITION:  PACU - hemodynamically stable.   Delay start of Pharmacological  VTE agent (>24hrs) due to surgical blood loss or risk of bleeding:  not applicable  Please fax a copy of this op note to my office at [phone] (please only include page 1 and 2 of the Case Information op note)

## 2016-03-08 NOTE — Transfer of Care (Signed)
Immediate Anesthesia Transfer of Care Note  Patient: Kristin Flores  Procedure(s) Performed: Procedure(s): TOTAL KNEE ARTHROPLASTY (Right)  Patient Location: PACU  Anesthesia Type:MAC and Spinal  Level of Consciousness: awake, alert , oriented and patient cooperative  Airway & Oxygen Therapy: Patient Spontanous Breathing and Patient connected to nasal cannula oxygen  Post-op Assessment: Report given to RN and Post -op Vital signs reviewed and stable  Post vital signs: Reviewed and stable  Last Vitals:  Filed Vitals:   03/08/16 0822  BP: 154/84  Pulse: 76  Temp: 37.1 C  Resp: 18    Last Pain: There were no vitals filed for this visit.       Complications: No apparent anesthesia complications

## 2016-03-08 NOTE — Evaluation (Signed)
Physical Therapy Evaluation Patient Details Name: Kristin Flores MRN: [id number] DOB: [birth year] Today's Date: 03/08/2016   History of Present Illness  70 y.o. female admitted to [facility] on 03/08/16 for elective R TKA.  Pt with significant PMHx of HTN, DM, and L ankle fusion.    Clinical Impression  Pt is POD #0 moving well to recliner with min assist and RW.  Limited by pain and weakness.  Pt will likely progress well enough to d/c home with husband's assist and HHPT f/u at discharge.  PT to follow acutely for deficits listed below.       Follow Up Recommendations Home health PT;Supervision for mobility/OOB    Equipment Recommendations  None recommended by PT    Recommendations for Other Services   NA    Precautions / Restrictions Precautions Precautions: Knee Precaution Booklet Issued: Yes (comment) Precaution Comments: knee exercise handout given Restrictions Weight Bearing Restrictions: No RLE Weight Bearing: Weight bearing as tolerated      Mobility  Bed Mobility Overal bed mobility: Needs Assistance Bed Mobility: Supine to Sit     Supine to sit: Min assist;HOB elevated     General bed mobility comments: Min assist to help progress right leg to EOB.   Transfers Overall transfer level: Needs assistance Equipment used: Rolling walker (2 wheeled) Transfers: Sit to/from Stand Sit to Stand: Min assist         General transfer comment: Min assit to support trunk during transitions.  Verbal cues for safe hand placement.   Ambulation/Gait Ambulation/Gait assistance: Min assist Ambulation Distance (Feet): 5 Feet Assistive device: Rolling walker (2 wheeled) Gait Pattern/deviations: Step-to pattern;Antalgic     General Gait Details: Moderately antalgic gait pattern, no signs of buckling, verbally reviewed WBAT status of right leg.          Balance Overall balance assessment: Needs assistance Sitting-balance support: Feet supported;No upper extremity  supported Sitting balance-Leahy Scale: Good     Standing balance support: Bilateral upper extremity supported Standing balance-Leahy Scale: Poor                               Pertinent Vitals/Pain Pain Assessment: 0-10 Pain Score: 6  Pain Location: right knee, especially with it extended in the recliner chair. Pain Descriptors / Indicators: Grimacing;Guarding Pain Intervention(s): Premedicated before session;Limited activity within patient's tolerance;Monitored during session;Repositioned    Home Living Family/patient expects to be discharged to:: Private residence Living Arrangements: Spouse/significant other Available Help at Discharge: Family;Available 24 hours/day Type of Home: House Home Access: Ramped entrance     Home Layout: One level Home Equipment: Cane - single point;Walker - 2 wheels      Prior Function Level of Independence: Independent with assistive device(s)         Comments: uses cane at times.         Extremity/Trunk Assessment   Upper Extremity Assessment: Defer to OT evaluation           Lower Extremity Assessment: RLE deficits/detail RLE Deficits / Details: right leg with normal post op pain and weakness, ankle 3/5, knee 2-/5, hip 2/5    Cervical / Trunk Assessment: Normal  Communication   Communication: No difficulties  Cognition Arousal/Alertness: Awake/alert Behavior During Therapy: WFL for tasks assessed/performed Overall Cognitive Status: Within Functional Limits for tasks assessed  Assessment/Plan    PT Assessment Patient needs continued PT services  PT Diagnosis Difficulty walking;Abnormality of gait;Generalized weakness;Acute pain   PT Problem List Decreased strength;Decreased range of motion;Decreased activity tolerance;Decreased balance;Decreased mobility;Decreased knowledge of use of DME;Pain  PT Treatment Interventions DME instruction;Gait training;Functional  mobility training;Therapeutic activities;Stair training;Therapeutic exercise;Balance training;Neuromuscular re-education;Patient/family education;Manual techniques;Modalities   PT Goals (Current goals can be found in the Care Plan section) Acute Rehab PT Goals Patient Stated Goal: to get her right knee better so she can do stuff without her husband there all the time (like go to the grocery store on her own).  PT Goal Formulation: With patient Time For Goal Achievement: 03/15/16 Potential to Achieve Goals: Good    Frequency 7X/week           End of Session Equipment Utilized During Treatment: Gait belt Activity Tolerance: Patient limited by pain Patient left: in chair;with call bell/phone within reach           Time: [id number] PT Time Calculation (min) (ACUTE ONLY): 21 min   Charges:   PT Evaluation $PT Eval Moderate Complexity: 1 Procedure          Paislei Dorval B. [place], [place], DPT [phone]   03/08/2016, 5:44 PM

## 2016-03-08 NOTE — Anesthesia Procedure Notes (Addendum)
Procedure Name: MAC Date/Time: 03/08/2016 9:36 AM Performed by: Layla Maw Pre-anesthesia Checklist: Patient identified, Patient being monitored, Timeout performed, Emergency Drugs available and Suction available Patient Re-evaluated:Patient Re-evaluated prior to inductionOxygen Delivery Method: Simple face mask and Nasal cannula Preoxygenation: Pre-oxygenation with 100% oxygen Number of attempts: 1 Placement Confirmation: positive ETCO2 Dental Injury: Teeth and Oropharynx as per pre-operative assessment    Spinal Patient location during procedure: OR Start time: 03/08/2016 9:39 AM End time: 03/08/2016 9:43 AM Staffing Anesthesiologist: Roderic Palau Performed by: anesthesiologist  Preanesthetic Checklist Completed: patient identified, surgical consent, pre-op evaluation, timeout performed, IV checked, risks and benefits discussed and monitors and equipment checked Spinal Block Patient position: sitting Prep: Betadine Patient monitoring: cardiac monitor, continuous pulse ox and blood pressure Approach: midline Location: L3-4 Injection technique: single-shot Needle Needle type: Pencan  Needle gauge: 24 G Needle length: 9 cm Assessment Sensory level: T6 Additional Notes Functioning IV was confirmed and monitors were applied. Sterile prep and drape, including hand hygiene and sterile gloves were used. The patient was positioned and the spine was prepped. The skin was anesthetized with lidocaine.  Free flow of clear CSF was obtained prior to injecting local anesthetic into the CSF.  The spinal needle aspirated freely following injection.  The needle was carefully withdrawn.  The patient tolerated the procedure well.

## 2016-03-08 NOTE — Progress Notes (Signed)
Orthopedic Tech Progress Note Patient Details:  Kristin Flores 04/23/1946 [id number]  CPM Right Knee CPM Right Knee: On Right Knee Flexion (Degrees): 90 Right Knee Extension (Degrees): 0 Additional Comments: trapeze bar patient helper Viewed order from doctor's order list  Hildred Priest 03/08/2016, 12:24 PM

## 2016-03-08 NOTE — H&P (Signed)
Kristin Flores MRN:  [id number] DOB/SEX:  1945/10/08/female  CHIEF COMPLAINT:  Painful right Knee  HISTORY: Patient is a 70 y.o. female presented with a history of pain in the right knee. Onset of symptoms was gradual starting a few years ago with gradually worsening course since that time. Patient has been treated conservatively with over-the-counter NSAIDs and activity modification. Patient currently rates pain in the knee at 10 out of 10 with activity. There is pain at night.  PAST MEDICAL HISTORY: There are no active problems to display for this patient.  Past Medical History  Diagnosis Date  . Hypertension   . Asthma   . Diabetes mellitus without complication (HCC)     borderline  . Constipation    Past Surgical History  Procedure Laterality Date  . Cholecystectomy    . Ankle fusion Left   . Colonoscopy       MEDICATIONS:   No prescriptions prior to admission    ALLERGIES:   Allergies  Allergen Reactions  . Codeine     "doesn't agree with me"    REVIEW OF SYSTEMS:  A comprehensive review of systems was negative except for: Musculoskeletal: positive for arthralgias and stiff joints   FAMILY HISTORY:  No family history on file.  SOCIAL HISTORY:   Social History  Substance Use Topics  . Smoking status: Never Smoker   . Smokeless tobacco: Not on file  . Alcohol Use: No     EXAMINATION:  Vital signs in last 24 hours:    There were no vitals taken for this visit. General appearance: alert and cooperative Head: Normocephalic, without obvious abnormality, atraumatic Heart: regular rate and rhythm, S1, S2 normal, no murmur, click, rub or gallop Abdomen: soft, non-tender; bowel sounds normal; no masses,  no organomegaly Extremities: extremities normal, atraumatic, no cyanosis or edema  Musculoskeletal:  ROM 0-120, Ligaments intact,  Imaging Review Plain radiographs demonstrate severe degenerative joint disease of the right knee. The overall alignment is  neutral. The bone quality appears to be excellent for age and reported activity level.  Assessment/Plan: Primary osteoarthritis, right knee   The patient history, physical examination and imaging studies are consistent with advanced degenerative joint disease of the right knee. The patient has failed conservative treatment.  The clearance notes were reviewed.  After discussion with the patient it was felt that Total Knee Replacement was indicated. The procedure,  risks, and benefits of total knee arthroplasty were presented and reviewed. The risks including but not limited to aseptic loosening, infection, blood clots, vascular injury, stiffness, patella tracking problems complications among others were discussed. The patient acknowledged the explanation, agreed to proceed with the plan.  Donia Ast 03/08/2016, 6:31 AM

## 2016-03-09 ENCOUNTER — Encounter (HOSPITAL_COMMUNITY): Payer: Self-pay | Admitting: Orthopedic Surgery

## 2016-03-09 DIAGNOSIS — M1711 Unilateral primary osteoarthritis, right knee: Secondary | ICD-10-CM | POA: Diagnosis not present

## 2016-03-09 DIAGNOSIS — Z96651 Presence of right artificial knee joint: Secondary | ICD-10-CM | POA: Diagnosis not present

## 2016-03-09 LAB — BASIC METABOLIC PANEL
ANION GAP: 10 (ref 5–15)
BUN: 15 mg/dL (ref 6–20)
CALCIUM: 9.2 mg/dL (ref 8.9–10.3)
CO2: 27 mmol/L (ref 22–32)
Chloride: 101 mmol/L (ref 101–111)
Creatinine, Ser: 1.02 mg/dL — ABNORMAL HIGH (ref 0.44–1.00)
GFR, EST NON AFRICAN AMERICAN: 55 mL/min — AB (ref >60)
GLUCOSE: 152 mg/dL — AB (ref 65–99)
POTASSIUM: 4.7 mmol/L (ref 3.5–5.1)
Sodium: 138 mmol/L (ref 135–145)

## 2016-03-09 LAB — CBC
HCT: 39.1 % (ref 36.0–46.0)
Hemoglobin: 12.4 g/dL (ref 12.0–15.0)
MCH: 26.3 pg (ref 26.0–34.0)
MCHC: 31.7 g/dL (ref 30.0–36.0)
MCV: 83 fL (ref 78.0–100.0)
PLATELETS: 311 10*3/uL (ref 150–400)
RBC: 4.71 MIL/uL (ref 3.87–5.11)
RDW: 14.8 % (ref 11.5–15.5)
WBC: 17.6 10*3/uL — AB (ref 4.0–10.5)

## 2016-03-09 LAB — HEMOGLOBIN A1C
Hgb A1c MFr Bld: 6.9 % — ABNORMAL HIGH (ref 4.8–5.6)
MEAN PLASMA GLUCOSE: 151 mg/dL

## 2016-03-09 LAB — GLUCOSE, CAPILLARY
GLUCOSE-CAPILLARY: 147 mg/dL — AB (ref 65–99)
Glucose-Capillary: 136 mg/dL — ABNORMAL HIGH (ref 65–99)
Glucose-Capillary: 118 mg/dL — ABNORMAL HIGH (ref 65–99)
Glucose-Capillary: 188 mg/dL — ABNORMAL HIGH (ref 65–99)

## 2016-03-09 MED ORDER — ENOXAPARIN SODIUM 40 MG/0.4ML ~~LOC~~ SOLN: 40.0000 mg | SUBCUTANEOUS | Status: DC

## 2016-03-09 MED ORDER — OXYCODONE HCL 5 MG PO TABS: 5.0000 mg | ORAL_TABLET | ORAL | Status: DC | PRN

## 2016-03-09 MED ORDER — OXYCODONE HCL ER 10 MG PO T12A: 10.0000 mg | EXTENDED_RELEASE_TABLET | Freq: Two times a day (BID) | ORAL | Status: DC

## 2016-03-09 MED ORDER — METHOCARBAMOL 500 MG PO TABS: 500.0000 mg | ORAL_TABLET | Freq: Four times a day (QID) | ORAL | Status: DC | PRN

## 2016-03-09 NOTE — Progress Notes (Signed)
Physical Therapy Treatment Patient Details Name: Kristin Flores MRN: [id number] DOB: 08-Aug-1946 Today's Date: 03/09/2016    History of Present Illness 70 y.o. female admitted to [facility] on 03/08/16 for elective R TKA.  Pt with significant PMHx of HTN, DM, and L ankle fusion.      PT Comments    Pt performed with increased encouragement.  Pt appears fearful to advance mobility at this time.  Pt advance gait and tolerated supine therapeutic exercises.  Will f/u this pm to progress pt.  Chaplain consult put in as patient appears depressed.  Pt pleasant during tx.    Follow Up Recommendations  Home health PT;Supervision for mobility/OOB     Equipment Recommendations  None recommended by PT    Recommendations for Other Services       Precautions / Restrictions Precautions Precautions: Knee Precaution Booklet Issued: Yes (comment) Precaution Comments: knee exercise handout given Restrictions Weight Bearing Restrictions: Yes RLE Weight Bearing: Weight bearing as tolerated    Mobility  Bed Mobility Overal bed mobility: Needs Assistance Bed Mobility: Supine to Sit     Supine to sit: Min assist;HOB elevated     General bed mobility comments: Min assist to help progress right leg to EOB.   Transfers Overall transfer level: Needs assistance Equipment used: Rolling walker (2 wheeled) Transfers: Sit to/from Stand Sit to Stand: Min assist;Mod assist         General transfer comment: Min assist to support trunk during transitions.  Verbal cues for safe hand placement. Pt descend to seated surface uncontrolled plopping into chair.    Ambulation/Gait Ambulation/Gait assistance: Min assist Ambulation Distance (Feet): 58 Feet Assistive device: Rolling walker (2 wheeled) Gait Pattern/deviations: Step-to pattern;Antalgic;Step-through pattern;Trunk flexed;Decreased stride length   Gait velocity interpretation: Below normal speed for age/gender General Gait Details: Moderately antalgic  gait pattern, no signs of buckling, verbally reviewed WBAT status of right leg. Pt required cues for R heel strike and R knee extension.     Stairs            Wheelchair Mobility    Modified Rankin (Stroke Patients Only)       Balance Overall balance assessment: Needs assistance   Sitting balance-Leahy Scale: Good       Standing balance-Leahy Scale: Poor                      Cognition Arousal/Alertness: Awake/alert Behavior During Therapy: WFL for tasks assessed/performed Overall Cognitive Status: Within Functional Limits for tasks assessed                      Exercises Total Joint Exercises Ankle Circles/Pumps: AROM;Both;10 reps;Supine Quad Sets: AROM;Right;10 reps;Supine Short Arc Quad: AAROM;Right;10 reps;Supine Heel Slides: AAROM;Right;10 reps;Supine Hip ABduction/ADduction: AAROM;Right;10 reps;Supine Straight Leg Raises: AAROM;Right;10 reps;Supine Goniometric ROM: Will assess this pm.      General Comments        Pertinent Vitals/Pain Pain Assessment: 0-10 Pain Score: 6  Pain Descriptors / Indicators: Grimacing;Guarding Pain Intervention(s): Monitored during session;Repositioned;Ice applied    Home Living                      Prior Function            PT Goals (current goals can now be found in the care plan section) Acute Rehab PT Goals Patient Stated Goal: to get her right knee better so she can do stuff without her husband there all the time (like go  to the grocery store on her own).  Potential to Achieve Goals: Good Progress towards PT goals: Progressing toward goals    Frequency  7X/week    PT Plan Current plan remains appropriate    Co-evaluation             End of Session Equipment Utilized During Treatment: Gait belt Activity Tolerance: Patient limited by pain Patient left: in chair;with call bell/phone within reach     Time: 0917-0957 PT Time Calculation (min) (ACUTE ONLY): 40 min  Charges:   $Gait Training: 8-22 mins $Therapeutic Exercise: 8-22 mins $Therapeutic Activity: 8-22 mins                    G Codes:      Kristin Flores April 06, 2016, 10:06 AM  Kristin Flores, PTA pager [phone]

## 2016-03-09 NOTE — Evaluation (Signed)
Occupational Therapy Evaluation Patient Details Name: Kristin Flores MRN: [id number] DOB: Apr 25, 1946 Today's Date: 03/09/2016    History of Present Illness 70 y.o. female admitted to [facility] on 03/08/16 for elective R TKA.  Pt with significant PMHx of HTN, DM, and L ankle fusion.     Clinical Impression   Pt with decline in function and safety with ADLs and ADL mobility with decreased balance and endurance. Pt would benefit from acute OT services to address impairments to increase level of function and safety    Follow Up Recommendations  Home health OT;Supervision/Assistance - 24 hour    Equipment Recommendations  3 in 1 bedside comode    Recommendations for Other Services       Precautions / Restrictions Precautions Precautions: Knee Precaution Booklet Issued: Yes (comment) Precaution Comments: knee exercise handout given Restrictions Weight Bearing Restrictions: Yes RLE Weight Bearing: Weight bearing as tolerated      Mobility Bed Mobility Overal bed mobility: Needs Assistance Bed Mobility: Supine to Sit     Supine to sit: Min assist;HOB elevated     General bed mobility comments: pt up in recliner upon entering room  Transfers Overall transfer level: Needs assistance Equipment used: Rolling walker (2 wheeled) Transfers: Sit to/from Stand Sit to Stand: Min assist         General transfer comment: Min assist to support trunk during transitions.  Verbal cues for safe hand placement. Pt descend to seated surface uncontrolled plopping into chair.      Balance Overall balance assessment: Needs assistance Sitting-balance support: No upper extremity supported;Feet supported Sitting balance-Leahy Scale: Good     Standing balance support: Bilateral upper extremity supported;During functional activity Standing balance-Leahy Scale: Poor                              ADL Overall ADL's : Needs assistance/impaired     Grooming: Wash/dry hands;Wash/dry  face;Standing;Min guard   Upper Body Bathing: Supervision/ safety;Set up;Sitting   Lower Body Bathing: Moderate assistance   Upper Body Dressing : Supervision/safety;Set up;Sitting   Lower Body Dressing: Maximal assistance   Toilet Transfer: Minimal assistance;Comfort height toilet;RW Toilet Transfer Details (indicate cue type and reason): cues to use grab bars Toileting- Clothing Manipulation and Hygiene: Minimal assistance;Sit to/from stand Toileting - Clothing Manipulation Details (indicate cue type and reason): cues to use grab bars Tub/ Shower Transfer: 3 in 1;Rolling walker;Grab bars   Functional mobility during ADLs: Minimal assistance;Cueing for safety;Rolling walker       Vision  wears reading glasses, no change from baseline              Pertinent Vitals/Pain Pain Assessment: 0-10 Pain Score: 5  Pain Location: R knee Pain Descriptors / Indicators: Sore Pain Intervention(s): Monitored during session;Premedicated before session;Repositioned     Hand Dominance  R handed   Extremity/Trunk Assessment Upper Extremity Assessment Upper Extremity Assessment: Overall WFL for tasks assessed       Cervical / Trunk Assessment Cervical / Trunk Assessment: Normal   Communication Communication Communication: No difficulties   Cognition Arousal/Alertness: Awake/alert Behavior During Therapy: WFL for tasks assessed/performed Overall Cognitive Status: Within Functional Limits for tasks assessed                     General Comments   pt very pleasant and cooperative, husband suportive                 Home Living  Family/patient expects to be discharged to:: Private residence Living Arrangements: Spouse/significant other Available Help at Discharge: Family;Available 24 hours/day Type of Home: House Home Access: Ramped entrance     Home Layout: One level     Bathroom Shower/Tub: Tub/shower unit;Walk-in shower   Bathroom Toilet: Standard      Home Equipment: Cane - single point;Walker - 2 wheels;Shower seat;Grab bars - tub/shower          Prior Functioning/Environment Level of Independence: Independent with assistive device(s)        Comments: uses cane at times.     OT Diagnosis: Acute pain   OT Problem List: Decreased knowledge of use of DME or AE;Decreased activity tolerance;Pain;Impaired balance (sitting and/or standing)   OT Treatment/Interventions: Patient/family education;Therapeutic activities;DME and/or AE instruction    OT Goals(Current goals can be found in the care plan section) Acute Rehab OT Goals Patient Stated Goal: get better and get back to being active OT Goal Formulation: With patient/family Time For Goal Achievement: 03/16/16 Potential to Achieve Goals: Good ADL Goals Pt Will Perform Grooming: with supervision;with set-up;standing Pt Will Perform Upper Body Bathing: with set-up;sitting Pt Will Perform Lower Body Bathing: with min assist Pt Will Perform Upper Body Dressing: with set-up;sitting Pt Will Transfer to Toilet: with min guard assist;with supervision Pt Will Perform Toileting - Clothing Manipulation and hygiene: with min guard assist;with supervision;sit to/from stand  OT Frequency: Min 2X/week   Barriers to D/C:    none                     End of Session Equipment Utilized During Treatment: Rolling walker;Other (comment) (3 in 1) CPM Right Knee CPM Right Knee: Off Right Knee Flexion (Degrees): 90 Right Knee Extension (Degrees): 0 Additional Comments: bone foam  Activity Tolerance: Patient tolerated treatment well Patient left: in chair;with call bell/phone within reach;with nursing/sitter in room   Time: 1131-1205 OT Time Calculation (min): 34 min Charges:  OT General Charges $OT Visit: 1 Procedure OT Evaluation $OT Eval Moderate Complexity: 1 Procedure OT Treatments $Self Care/Home Management : 8-22 mins $Therapeutic Activity: 8-22 mins G-Codes:    Britt Bottom 03/09/2016, 12:52 PM

## 2016-03-09 NOTE — Care Management Important Message (Signed)
Important Message  Patient Details  Name: Kristin Flores MRN: [id number] Date of Birth: [birth year]   Medicare Important Message Given:  Yes    Loann Quill 03/09/2016, 8:28 AM

## 2016-03-09 NOTE — Progress Notes (Signed)
   03/09/16 1100  Clinical Encounter Type  Visited With Patient and family together  Visit Type Spiritual support  Referral From Other (Comment) (Physical Therapy)  Spiritual Encounters  Spiritual Needs Prayer;Emotional  Stress Factors  Patient Stress Factors Other (Comment) (Pain)  Patient is in pain from physical therapy, and it was her therapist who requested that a chaplain visit. Chaplain spent about 35 minutes with couple, discussing her condition and how things would be for her when she goes home--which she hopes will be today. She is looking forward to excellent care from her family and seemed in good spirits other than the pain. She has a great sense of humor. Chaplain prayed with couple before leaving. Angelina Neece, Chaplain

## 2016-03-09 NOTE — Progress Notes (Signed)
Orthopedic Tech Progress Note Patient Details:  Kristin Flores [birth year] [id number]  CPM Right Knee CPM Right Knee: On Right Knee Flexion (Degrees): 90 Right Knee Extension (Degrees): 0 Additional Comments: bone foam   [name] 03/09/2016, 6:59 PM

## 2016-03-09 NOTE — Progress Notes (Signed)
SPORTS MEDICINE AND JOINT REPLACEMENT  Lara Mulch, MD   [facility] PA-C [place], [place], [place]  [address]                             [phone]   PROGRESS NOTE  Subjective:  negative for Chest Pain  negative for Shortness of Breath  negative for Nausea/Vomiting   negative for Calf Pain  negative for Bowel Movement   Tolerating Diet: yes         Patient reports pain as 7 on 0-10 scale.    Objective: Vital signs in last 24 hours:   Patient Vitals for the past 24 hrs:  BP Temp Temp src Pulse Resp SpO2 Height Weight  03/09/16 0002 120/69 mmHg 98.4 F (36.9 C) Oral 89 16 95 % - -  03/08/16 1957 (!) 151/83 mmHg 97.4 F (36.3 C) Oral 97 16 98 % - -  03/08/16 1700 - - - - - 98 % - -  03/08/16 1425 (!) 146/73 mmHg 98.5 F (36.9 C) Oral 84 14 97 % - -  03/08/16 1357 - 98.3 F (36.8 C) - - - - - -  03/08/16 1345 - - - 81 10 97 % - -  03/08/16 1330 - - - 74 12 97 % - -  03/08/16 1322 (!) 144/82 mmHg - - 73 13 96 % - -  03/08/16 1315 - - - 72 12 97 % - -  03/08/16 1300 - - - 70 14 98 % - -  03/08/16 1245 - - - 71 (!) 22 98 % - -  03/08/16 1231 - - - 70 14 97 % - -  03/08/16 1230 132/74 mmHg 97.8 F (36.6 C) - 71 17 97 % - -  03/08/16 1222 127/65 mmHg - - 71 15 97 % - -  03/08/16 1215 - - - 72 12 98 % - -  03/08/16 1207 (!) 126/93 mmHg - - 74 (!) 21 97 % - -  03/08/16 1200 - - - 73 13 98 % - -  03/08/16 1152 131/71 mmHg - - 74 15 98 % - -  03/08/16 1145 - - - 80 15 96 % - -  03/08/16 1137 131/72 mmHg 97.9 F (36.6 C) - 83 16 97 % - -  03/08/16 0822 (!) 154/84 mmHg 98.8 F (37.1 C) Oral 76 18 96 % 5' 3.5" (1.613 m) 112.9 kg (248 lb 14.4 oz)    @flow {1959:LAST@   Intake/Output from previous day:   06/05 0701 - 06/06 0700 In: 2645 [P.O.:760; I.V.:1775] Out: 320 [Urine:300]   Intake/Output this shift:       Intake/Output      06/05 0701 - 06/06 0700 06/06 0701 - 06/07 0700   P.O. 760    I.V. (mL/kg) 1775 (15.7)    IV Piggyback 110    Total  Intake(mL/kg) 2645 (23.4)    Urine (mL/kg/hr) 300    Blood 20    Total Output 320     Net +2325          Urine Occurrence 1 x       LABORATORY DATA:  Recent Labs  03/08/16 1510 03/09/16 0556  WBC 9.5 17.6*  HGB 13.0 12.4  HCT 40.8 39.1  PLT 310 311    Recent Labs  03/08/16 1510 03/09/16 0556  NA  --  138  K  --  4.7  CL  --  101  CO2  --  27  BUN  --  15  CREATININE 0.68 1.02*  GLUCOSE  --  152*  CALCIUM  --  9.2   Lab Results  Component Value Date   INR 0.98 02/25/2016    Examination:  General appearance: alert, cooperative and no distress Extremities: extremities normal, atraumatic, no cyanosis or edema  Wound Exam: clean, dry, intact   Drainage:  None: wound tissue dry  Motor Exam: Quadriceps and Hamstrings Intact  Sensory Exam: Superficial Peroneal, Deep Peroneal and Tibial normal   Assessment:    1 Day Post-Op  Procedure(s) (LRB): TOTAL KNEE ARTHROPLASTY (Right)  ADDITIONAL DIAGNOSIS:  Active Problems:   S/P total knee replacement  Acute Blood Loss Anemia   Plan: Physical Therapy as ordered Weight Bearing as Tolerated (WBAT)  DVT Prophylaxis:  Lovenox  DISCHARGE PLAN: Home  DISCHARGE NEEDS: HHPT   Patient will benefit from improved pain control and PT. D/C either later this evening or tomorrow morning         Donia Ast 03/09/2016, 7:03 AM

## 2016-03-09 NOTE — Care Management Note (Signed)
Case Management Note  Patient Details  Name: Kristin Flores MRN: [id number] Date of Birth: 06-14-1946  Subjective/Objective: 70 y.o. F admitted 03/08/2016 for R TKA. Pt has been referred to  [facility] [place]) for HHPT at discharge. Has 3n1 in room per [facility] rep.                    Action/Plan:CM will sign off for now but will be available should additional disposition/discharge needs arise.   Expected Discharge Date:                  Expected Discharge Plan:     In-House Referral:     Discharge planning Services  CM Consult  Post Acute Care Choice:    Choice offered to:     DME Arranged:  3-N-1 DME Agency:     HH Arranged:    [place] Agency:     Status of Service:  Completed, signed off  Medicare Important Message Given:  Yes Date Medicare IM Given:    Medicare IM give by:    Date Additional Medicare IM Given:    Additional Medicare Important Message give by:     If discussed at [place] of Stay Meetings, dates discussed:    Additional Comments:  Delrae Sawyers, RN 03/09/2016, 4:06 PM

## 2016-03-09 NOTE — Progress Notes (Signed)
Physical Therapy Treatment Patient Details Name: Kristin Flores MRN: [id number] DOB: Jul 29, 1946 Today's Date: 03/09/2016    History of Present Illness 70 y.o. female admitted to [facility] on 03/08/16 for elective R TKA.  Pt with significant PMHx of HTN, DM, and L ankle fusion.      PT Comments    Pt performed increased mobility with encouragement from PTA.  Able to progress gait distance and therapeutic exercises.  Will f/u in am for d/c home.  HEP issued and reviewed.    Follow Up Recommendations  Home health PT;Supervision for mobility/OOB     Equipment Recommendations  None recommended by PT    Recommendations for Other Services       Precautions / Restrictions Precautions Precautions: Knee Precaution Booklet Issued: Yes (comment) Precaution Comments: knee exercise handout given Restrictions Weight Bearing Restrictions: Yes RLE Weight Bearing: Weight bearing as tolerated    Mobility  Bed Mobility Overal bed mobility: Needs Assistance Bed Mobility: Supine to Sit     Supine to sit: Min assist;HOB elevated     General bed mobility comments: pt up in recliner upon entering room  Transfers Overall transfer level: Needs assistance Equipment used: Rolling walker (2 wheeled) Transfers: Sit to/from Stand Sit to Stand: Min assist         General transfer comment: Min assist to support trunk during transitions.  Verbal cues for safe hand placement. Pt descend to seated surface uncontrolled plopping into chair.    Ambulation/Gait Ambulation/Gait assistance: Min assist Ambulation Distance (Feet): 200 Feet Assistive device: Rolling walker (2 wheeled) Gait Pattern/deviations: Step-to pattern;Antalgic;Trunk flexed   Gait velocity interpretation: Below normal speed for age/gender General Gait Details: Pt performed with step through sequencing.  Able to advance gait training distance.  Pty c/o of pain and fatigue.  Pt required cues for forward gaze R heel strike and R Knee  extension.     Stairs            Wheelchair Mobility    Modified Rankin (Stroke Patients Only)       Balance Overall balance assessment: Needs assistance Sitting-balance support: No upper extremity supported;Feet supported Sitting balance-Leahy Scale: Good     Standing balance support: Bilateral upper extremity supported;During functional activity Standing balance-Leahy Scale: Poor                      Cognition Arousal/Alertness: Awake/alert Behavior During Therapy: WFL for tasks assessed/performed Overall Cognitive Status: Within Functional Limits for tasks assessed                      Exercises Total Joint Exercises Ankle Circles/Pumps: AROM;Both;10 reps;Supine Quad Sets: AROM;Right;10 reps;Supine Short Arc Quad: AAROM;Right;10 reps;Supine Heel Slides: AAROM;Right;10 reps;Supine Hip ABduction/ADduction: AAROM;Right;10 reps;Supine Straight Leg Raises: AAROM;Right;10 reps;Supine Knee Flexion: AROM;AAROM;Right;Seated;5 reps (1x5 AAROM with 10 sec hold, 1x5 AROM with 10 sec hold.  ) Goniometric ROM: 0-70 degrees. R knee.      General Comments        Pertinent Vitals/Pain Pain Assessment: 0-10 Pain Score: 8  Pain Location: R knee Pain Descriptors / Indicators: Sore Pain Intervention(s): Monitored during session;Repositioned;Ice applied    Home Living Family/patient expects to be discharged to:: Private residence Living Arrangements: Spouse/significant other Available Help at Discharge: Family;Available 24 hours/day Type of Home: House Home Access: Ramped entrance   Home Layout: One level Home Equipment: Cane - single point;Walker - 2 wheels;Shower seat;Grab bars - tub/shower      Prior Function Level of  Independence: Independent with assistive device(s)      Comments: uses cane at times.    PT Goals (current goals can now be found in the care plan section) Acute Rehab PT Goals Patient Stated Goal: get better and get back to being  active Potential to Achieve Goals: Good Progress towards PT goals: Progressing toward goals    Frequency  7X/week    PT Plan Current plan remains appropriate    Co-evaluation             End of Session Equipment Utilized During Treatment: Gait belt Activity Tolerance: Patient limited by pain Patient left: in chair;with call bell/phone within reach     Time: 1453-1524 PT Time Calculation (min) (ACUTE ONLY): 31 min  Charges:  $Gait Training: 8-22 mins $Therapeutic Exercise: 8-22 mins                    G Codes:      [name] 2016-03-16, 3:31 PM  Governor [place], PTA pager [phone]

## 2016-03-10 LAB — CBC
HCT: 35.6 % — ABNORMAL LOW (ref 36.0–46.0)
HEMOGLOBIN: 11.1 g/dL — AB (ref 12.0–15.0)
MCH: 26.1 pg (ref 26.0–34.0)
MCHC: 31.2 g/dL (ref 30.0–36.0)
MCV: 83.8 fL (ref 78.0–100.0)
PLATELETS: 279 10*3/uL (ref 150–400)
RBC: 4.25 MIL/uL (ref 3.87–5.11)
RDW: 15.3 % (ref 11.5–15.5)
WBC: 11.6 10*3/uL — AB (ref 4.0–10.5)

## 2016-03-10 LAB — GLUCOSE, CAPILLARY
GLUCOSE-CAPILLARY: 134 mg/dL — AB (ref 65–99)
Glucose-Capillary: 130 mg/dL — ABNORMAL HIGH (ref 65–99)

## 2016-03-10 NOTE — Progress Notes (Signed)
SPORTS MEDICINE AND JOINT REPLACEMENT  Lara Mulch, MD   [facility] PA-C [place], [place], [place]  [address]                             [phone]   PROGRESS NOTE  Subjective:  negative for Chest Pain  negative for Shortness of Breath  negative for Nausea/Vomiting   negative for Calf Pain  negative for Bowel Movement   Tolerating Diet: yes         Patient reports pain as 6 on 0-10 scale.    Objective: Vital signs in last 24 hours:   Patient Vitals for the past 24 hrs:  BP Temp Temp src Pulse Resp SpO2  03/10/16 0500 128/65 mmHg 97.8 F (36.6 C) Oral 78 16 96 %  03/09/16 2042 (!) 146/57 mmHg 99.2 F (37.3 C) Oral 76 17 97 %  03/09/16 1235 136/61 mmHg 98.1 F (36.7 C) - 83 18 98 %    @flow {1959:LAST@   Intake/Output from previous day:   06/06 0701 - 06/07 0700 In: 1080 [P.O.:1080] Out: -    Intake/Output this shift:       Intake/Output      06/06 0701 - 06/07 0700 06/07 0701 - 06/08 0700   P.O. 1080    I.V. (mL/kg)     IV Piggyback     Total Intake(mL/kg) 1080 (9.6)    Urine (mL/kg/hr)     Blood     Total Output       Net +1080          Urine Occurrence 3 x       LABORATORY DATA:  Recent Labs  03/08/16 1510 03/09/16 0556  WBC 9.5 17.6*  HGB 13.0 12.4  HCT 40.8 39.1  PLT 310 311    Recent Labs  03/08/16 1510 03/09/16 0556  NA  --  138  K  --  4.7  CL  --  101  CO2  --  27  BUN  --  15  CREATININE 0.68 1.02*  GLUCOSE  --  152*  CALCIUM  --  9.2   Lab Results  Component Value Date   INR 0.98 02/25/2016    Examination:  General appearance: alert, cooperative and no distress Extremities: extremities normal, atraumatic, no cyanosis or edema  Wound Exam: clean, dry, intact   Drainage:  None: wound tissue dry  Motor Exam: Quadriceps and Hamstrings Intact  Sensory Exam: Superficial Peroneal, Deep Peroneal and Tibial normal   Assessment:    2 Days Post-Op  Procedure(s) (LRB): TOTAL KNEE ARTHROPLASTY  (Right)  ADDITIONAL DIAGNOSIS:  Active Problems:   S/P total knee replacement  Acute Blood Loss Anemia   Plan: Physical Therapy as ordered Weight Bearing as Tolerated (WBAT)  DVT Prophylaxis:  Lovenox  DISCHARGE PLAN: Home  DISCHARGE NEEDS: HHPT   Patient looking much better today. Will D/C to home today.          Donia Ast 03/10/2016, 7:09 AM

## 2016-03-10 NOTE — Progress Notes (Signed)
Pt ready for d/c home per MD. Cleared PT/OT goals, all equipment needed pt's husband reports is at home. Discharge instructions and prescriptions reviewed with pt and her husband, all their questions answered. Belongings packed and sent with pt. Assisted to car by volunteer via wheelchair.   [place], Jerry Caras

## 2016-03-10 NOTE — Progress Notes (Signed)
Physical Therapy Treatment Patient Details Name: Kristin Flores MRN: [id number] DOB: [birth year] Today's Date: 03/10/2016    History of Present Illness 70 y.o. female admitted to [facility] on 03/08/16 for elective R TKA.  Pt with significant PMHx of HTN, DM, and L ankle fusion.      PT Comments    Pt performed mobility slow and guarded this am.  Pt required cues for sequencing during transfers and gait and remains fearful of falling.  Will f/u this pm if patient remains hospitalized.    Follow Up Recommendations  Home health PT;Supervision for mobility/OOB     Equipment Recommendations  None recommended by PT    Recommendations for Other Services       Precautions / Restrictions Precautions Precautions: Knee Precaution Booklet Issued: Yes (comment) Precaution Comments: knee exercise handout given Restrictions Weight Bearing Restrictions: Yes RLE Weight Bearing: Weight bearing as tolerated    Mobility  Bed Mobility               General bed mobility comments: Pt sitting edge of bed on arrival nurse present attempting to transfer to [facility].    Transfers Overall transfer level: Needs assistance Equipment used: Rolling walker (2 wheeled) Transfers: Sit to/from Stand Sit to Stand: Min assist;Supervision         General transfer comment: Pt required min assist from bed and Supervision from [facility].  Pt required cues for hand placement and advancement of RLE forward.    Ambulation/Gait Ambulation/Gait assistance: Min assist;Min guard Ambulation Distance (Feet): 200 Feet Assistive device: Rolling walker (2 wheeled) Gait Pattern/deviations: Step-to pattern;Step-through pattern;Antalgic;Decreased stride length;Trunk flexed     General Gait Details: Pt required cues for upper trunk control L foot clearance and B stride length.  Pt slow and guarded during gait training.     Stairs            Wheelchair Mobility    Modified Rankin (Stroke Patients Only)       Balance  Overall balance assessment: Needs assistance   Sitting balance-Leahy Scale: Good       Standing balance-Leahy Scale: Poor                      Cognition Arousal/Alertness: Awake/alert Behavior During Therapy: WFL for tasks assessed/performed Overall Cognitive Status: Within Functional Limits for tasks assessed                      Exercises Total Joint Exercises Ankle Circles/Pumps: AROM;Both;10 reps;Supine Quad Sets: AROM;Right;10 reps;Supine Heel Slides: AAROM;Right;10 reps;Supine Hip ABduction/ADduction: AAROM;Right;10 reps;Supine Straight Leg Raises: AAROM;Right;10 reps;Supine    General Comments        Pertinent Vitals/Pain Pain Assessment: 0-10 Pain Score: 8  Pain Location: R knee Pain Descriptors / Indicators: Discomfort;Sore Pain Intervention(s): Monitored during session;Repositioned;Ice applied    Home Living                      Prior Function            PT Goals (current goals can now be found in the care plan section) Acute Rehab PT Goals Patient Stated Goal: get better and get back to being active Potential to Achieve Goals: Good Progress towards PT goals: Progressing toward goals    Frequency  7X/week    PT Plan Current plan remains appropriate    Co-evaluation             End of Session Equipment Utilized During Treatment:  Gait belt Activity Tolerance: Patient limited by pain Patient left: in chair;with call bell/phone within reach     Time: 0929-1004 PT Time Calculation (min) (ACUTE ONLY): 35 min  Charges:  $Gait Training: 8-22 mins $Therapeutic Exercise: 8-22 mins                    G Codes:      [name] 2016-03-25, 10:07 AM  Governor Rooks, PTA pager [phone]

## 2016-03-10 NOTE — Discharge Summary (Signed)
SPORTS MEDICINE & JOINT REPLACEMENT   Kristin Mulch, MD   Carlynn Spry, PA-C Kristin Shadow, PA-C [address], [place], [place]  [address]                             [phone]  PATIENT ID: Kristin Flores        MRN:  [id number]          DOB/AGE: 10-12-1945 / 70 y.o.    DISCHARGE SUMMARY  ADMISSION DATE:    03/08/2016 DISCHARGE DATE:   03/10/2016   ADMISSION DIAGNOSIS: primary osteoarthritis right knee    DISCHARGE DIAGNOSIS:  primary osteoarthritis right knee    ADDITIONAL DIAGNOSIS: Active Problems:   S/P total knee replacement  Past Medical History  Diagnosis Date  . Hypertension   . Asthma   . Diabetes mellitus without complication (HCC)     borderline  . Constipation     PROCEDURE: Procedure(s): TOTAL KNEE ARTHROPLASTY on 03/08/2016  CONSULTS:     HISTORY:  See H&P in chart  HOSPITAL COURSE:  Kristin Flores is a 70 y.o. admitted on 03/08/2016 and found to have a diagnosis of primary osteoarthritis right knee.  After appropriate laboratory studies were obtained  they were taken to the operating room on 03/08/2016 and underwent Procedure(s): TOTAL KNEE ARTHROPLASTY.   They were given perioperative antibiotics:  Anti-infectives    Start     Dose/Rate Route Frequency Ordered Stop   03/08/16 1600  ceFAZolin (ANCEF) IVPB 1 g/50 mL premix     1 g 100 mL/hr over 30 Minutes Intravenous Every 6 hours 03/08/16 1439 03/08/16 2212   03/08/16 0930  ceFAZolin (ANCEF) IVPB 2g/100 mL premix     2 g 200 mL/hr over 30 Minutes Intravenous To [facility] 03/05/16 1413 03/08/16 1012    .  Patient given tranexamic acid IV or topical and exparel intra-operatively.  Tolerated the procedure well.    POD# 1: Vital signs were stable.  Patient denied Chest pain, shortness of breath, or calf pain.  Patient was started on Lovenox 30 mg subcutaneously twice daily at 8am.  Consults to PT, OT, and care management were made.  The patient was weight bearing as tolerated.  CPM was  placed on the operative leg 0-90 degrees for 6-8 hours a day. When out of the CPM, patient was placed in the foam block to achieve full extension. Incentive spirometry was taught.  Dressing was changed.       POD #2, Continued  PT for ambulation and exercise program.  IV saline locked.  O2 discontinued.    The remainder of the hospital course was dedicated to ambulation and strengthening.   The patient was discharged on 2 Days Post-Op in  Good condition.  Blood products given:none  DIAGNOSTIC STUDIES: Recent vital signs: Patient Vitals for the past 24 hrs:  BP Temp Temp src Pulse Resp SpO2  03/10/16 0500 128/65 mmHg 97.8 F (36.6 C) Oral 78 16 96 %  03/09/16 2042 (!) 146/57 mmHg 99.2 F (37.3 C) Oral 76 17 97 %  03/09/16 1235 136/61 mmHg 98.1 F (36.7 C) - 83 18 98 %       Recent laboratory studies:  Recent Labs  03/08/16 1510 03/09/16 0556  WBC 9.5 17.6*  HGB 13.0 12.4  HCT 40.8 39.1  PLT 310 311    Recent Labs  03/08/16 1510 03/09/16 0556  NA  --  138  K  --  4.7  CL  --  101  CO2  --  27  BUN  --  15  CREATININE 0.68 1.02*  GLUCOSE  --  152*  CALCIUM  --  9.2   Lab Results  Component Value Date   INR 0.98 02/25/2016     Recent Radiographic Studies :  Dg Chest 2 View  02/25/2016  CLINICAL DATA:  Preop for right total knee replacement. EXAM: CHEST  2 VIEW COMPARISON:  None. FINDINGS: The heart size and mediastinal contours are within normal limits. Both lungs are clear. The visualized skeletal structures are unremarkable. IMPRESSION: No active cardiopulmonary disease. Electronically Signed   By: Marijo Conception, M.D.   On: 02/25/2016 16:53    DISCHARGE INSTRUCTIONS:   DISCHARGE MEDICATIONS:     Medication List    STOP taking these medications        cyclobenzaprine 10 MG tablet  Commonly known as:  FLEXERIL     diclofenac sodium 1 % Gel  Commonly known as:  VOLTAREN     traMADol 50 MG tablet  Commonly known as:  ULTRAM      TAKE these  medications        albuterol 108 (90 Base) MCG/ACT inhaler  Commonly known as:  PROVENTIL HFA;VENTOLIN HFA  Inhale 1-2 puffs into the lungs every 6 (six) hours as needed for wheezing or shortness of breath.     aspirin EC 81 MG tablet  Take 81 mg by mouth at bedtime.     benazepril 40 MG tablet  Commonly known as:  LOTENSIN  Take 40 mg by mouth at bedtime.     enoxaparin 40 MG/0.4ML injection  Commonly known as:  LOVENOX  Inject 0.4 mLs (40 mg total) into the skin daily.     metFORMIN 500 MG 24 hr tablet  Commonly known as:  GLUCOPHAGE-XR  Take 500 mg by mouth daily with breakfast.     methocarbamol 500 MG tablet  Commonly known as:  ROBAXIN  Take 1-2 tablets (500-1,000 mg total) by mouth every 6 (six) hours as needed for muscle spasms.     omeprazole 40 MG capsule  Commonly known as:  PRILOSEC  Take 40 mg by mouth daily.     oxyCODONE 5 MG immediate release tablet  Commonly known as:  Oxy IR/ROXICODONE  Take 1-2 tablets (5-10 mg total) by mouth every 3 (three) hours as needed for breakthrough pain.     oxyCODONE 10 mg 12 hr tablet  Commonly known as:  OXYCONTIN  Take 1 tablet (10 mg total) by mouth every 12 (twelve) hours.     polyethylene glycol packet  Commonly known as:  MIRALAX / GLYCOLAX  Take 17 g by mouth daily as needed.     rosuvastatin 10 MG tablet  Commonly known as:  CRESTOR  Take 10 mg by mouth every evening.        FOLLOW UP VISIT:    DISPOSITION: HOME VS. SNF  CONDITION:  Good   Donia Ast 03/10/2016, 7:11 AM

## 2016-03-11 DIAGNOSIS — Z79891 Long term (current) use of opiate analgesic: Secondary | ICD-10-CM | POA: Diagnosis not present

## 2016-03-11 DIAGNOSIS — Z7984 Long term (current) use of oral hypoglycemic drugs: Secondary | ICD-10-CM | POA: Diagnosis not present

## 2016-03-11 DIAGNOSIS — Z96651 Presence of right artificial knee joint: Secondary | ICD-10-CM | POA: Diagnosis not present

## 2016-03-11 DIAGNOSIS — Z471 Aftercare following joint replacement surgery: Secondary | ICD-10-CM | POA: Diagnosis not present

## 2016-03-11 DIAGNOSIS — J45909 Unspecified asthma, uncomplicated: Secondary | ICD-10-CM | POA: Diagnosis not present

## 2016-03-11 DIAGNOSIS — I10 Essential (primary) hypertension: Secondary | ICD-10-CM | POA: Diagnosis not present

## 2016-03-11 DIAGNOSIS — E119 Type 2 diabetes mellitus without complications: Secondary | ICD-10-CM | POA: Diagnosis not present

## 2016-03-11 DIAGNOSIS — R112 Nausea with vomiting, unspecified: Secondary | ICD-10-CM | POA: Diagnosis not present

## 2016-03-11 DIAGNOSIS — K5900 Constipation, unspecified: Secondary | ICD-10-CM | POA: Diagnosis not present

## 2016-03-11 DIAGNOSIS — Z7901 Long term (current) use of anticoagulants: Secondary | ICD-10-CM | POA: Diagnosis not present

## 2016-03-12 DIAGNOSIS — Z96651 Presence of right artificial knee joint: Secondary | ICD-10-CM | POA: Diagnosis not present

## 2016-03-12 DIAGNOSIS — Z7901 Long term (current) use of anticoagulants: Secondary | ICD-10-CM | POA: Diagnosis not present

## 2016-03-12 DIAGNOSIS — Z7984 Long term (current) use of oral hypoglycemic drugs: Secondary | ICD-10-CM | POA: Diagnosis not present

## 2016-03-12 DIAGNOSIS — Z471 Aftercare following joint replacement surgery: Secondary | ICD-10-CM | POA: Diagnosis not present

## 2016-03-12 DIAGNOSIS — R112 Nausea with vomiting, unspecified: Secondary | ICD-10-CM | POA: Diagnosis not present

## 2016-03-12 DIAGNOSIS — I10 Essential (primary) hypertension: Secondary | ICD-10-CM | POA: Diagnosis not present

## 2016-03-12 DIAGNOSIS — Z79891 Long term (current) use of opiate analgesic: Secondary | ICD-10-CM | POA: Diagnosis not present

## 2016-03-12 DIAGNOSIS — J45909 Unspecified asthma, uncomplicated: Secondary | ICD-10-CM | POA: Diagnosis not present

## 2016-03-12 DIAGNOSIS — E119 Type 2 diabetes mellitus without complications: Secondary | ICD-10-CM | POA: Diagnosis not present

## 2016-03-12 DIAGNOSIS — K5900 Constipation, unspecified: Secondary | ICD-10-CM | POA: Diagnosis not present

## 2016-03-13 DIAGNOSIS — J45909 Unspecified asthma, uncomplicated: Secondary | ICD-10-CM | POA: Diagnosis not present

## 2016-03-13 DIAGNOSIS — R112 Nausea with vomiting, unspecified: Secondary | ICD-10-CM | POA: Diagnosis not present

## 2016-03-13 DIAGNOSIS — K5900 Constipation, unspecified: Secondary | ICD-10-CM | POA: Diagnosis not present

## 2016-03-13 DIAGNOSIS — Z96651 Presence of right artificial knee joint: Secondary | ICD-10-CM | POA: Diagnosis not present

## 2016-03-13 DIAGNOSIS — Z7984 Long term (current) use of oral hypoglycemic drugs: Secondary | ICD-10-CM | POA: Diagnosis not present

## 2016-03-13 DIAGNOSIS — E119 Type 2 diabetes mellitus without complications: Secondary | ICD-10-CM | POA: Diagnosis not present

## 2016-03-13 DIAGNOSIS — Z471 Aftercare following joint replacement surgery: Secondary | ICD-10-CM | POA: Diagnosis not present

## 2016-03-13 DIAGNOSIS — Z79891 Long term (current) use of opiate analgesic: Secondary | ICD-10-CM | POA: Diagnosis not present

## 2016-03-13 DIAGNOSIS — I10 Essential (primary) hypertension: Secondary | ICD-10-CM | POA: Diagnosis not present

## 2016-03-13 DIAGNOSIS — Z7901 Long term (current) use of anticoagulants: Secondary | ICD-10-CM | POA: Diagnosis not present

## 2016-03-15 DIAGNOSIS — Z7984 Long term (current) use of oral hypoglycemic drugs: Secondary | ICD-10-CM | POA: Diagnosis not present

## 2016-03-15 DIAGNOSIS — Z79891 Long term (current) use of opiate analgesic: Secondary | ICD-10-CM | POA: Diagnosis not present

## 2016-03-15 DIAGNOSIS — E119 Type 2 diabetes mellitus without complications: Secondary | ICD-10-CM | POA: Diagnosis not present

## 2016-03-15 DIAGNOSIS — Z471 Aftercare following joint replacement surgery: Secondary | ICD-10-CM | POA: Diagnosis not present

## 2016-03-15 DIAGNOSIS — I10 Essential (primary) hypertension: Secondary | ICD-10-CM | POA: Diagnosis not present

## 2016-03-15 DIAGNOSIS — J45909 Unspecified asthma, uncomplicated: Secondary | ICD-10-CM | POA: Diagnosis not present

## 2016-03-15 DIAGNOSIS — K5900 Constipation, unspecified: Secondary | ICD-10-CM | POA: Diagnosis not present

## 2016-03-15 DIAGNOSIS — Z96651 Presence of right artificial knee joint: Secondary | ICD-10-CM | POA: Diagnosis not present

## 2016-03-15 DIAGNOSIS — R112 Nausea with vomiting, unspecified: Secondary | ICD-10-CM | POA: Diagnosis not present

## 2016-03-15 DIAGNOSIS — Z7901 Long term (current) use of anticoagulants: Secondary | ICD-10-CM | POA: Diagnosis not present

## 2016-03-17 DIAGNOSIS — I10 Essential (primary) hypertension: Secondary | ICD-10-CM | POA: Diagnosis not present

## 2016-03-17 DIAGNOSIS — R112 Nausea with vomiting, unspecified: Secondary | ICD-10-CM | POA: Diagnosis not present

## 2016-03-17 DIAGNOSIS — Z7901 Long term (current) use of anticoagulants: Secondary | ICD-10-CM | POA: Diagnosis not present

## 2016-03-17 DIAGNOSIS — Z96651 Presence of right artificial knee joint: Secondary | ICD-10-CM | POA: Diagnosis not present

## 2016-03-17 DIAGNOSIS — Z79891 Long term (current) use of opiate analgesic: Secondary | ICD-10-CM | POA: Diagnosis not present

## 2016-03-17 DIAGNOSIS — Z7984 Long term (current) use of oral hypoglycemic drugs: Secondary | ICD-10-CM | POA: Diagnosis not present

## 2016-03-17 DIAGNOSIS — Z471 Aftercare following joint replacement surgery: Secondary | ICD-10-CM | POA: Diagnosis not present

## 2016-03-17 DIAGNOSIS — E119 Type 2 diabetes mellitus without complications: Secondary | ICD-10-CM | POA: Diagnosis not present

## 2016-03-17 DIAGNOSIS — K5900 Constipation, unspecified: Secondary | ICD-10-CM | POA: Diagnosis not present

## 2016-03-17 DIAGNOSIS — J45909 Unspecified asthma, uncomplicated: Secondary | ICD-10-CM | POA: Diagnosis not present

## 2016-03-19 DIAGNOSIS — I10 Essential (primary) hypertension: Secondary | ICD-10-CM | POA: Diagnosis not present

## 2016-03-19 DIAGNOSIS — J45909 Unspecified asthma, uncomplicated: Secondary | ICD-10-CM | POA: Diagnosis not present

## 2016-03-19 DIAGNOSIS — Z7901 Long term (current) use of anticoagulants: Secondary | ICD-10-CM | POA: Diagnosis not present

## 2016-03-19 DIAGNOSIS — R112 Nausea with vomiting, unspecified: Secondary | ICD-10-CM | POA: Diagnosis not present

## 2016-03-19 DIAGNOSIS — Z7984 Long term (current) use of oral hypoglycemic drugs: Secondary | ICD-10-CM | POA: Diagnosis not present

## 2016-03-19 DIAGNOSIS — K5900 Constipation, unspecified: Secondary | ICD-10-CM | POA: Diagnosis not present

## 2016-03-19 DIAGNOSIS — E119 Type 2 diabetes mellitus without complications: Secondary | ICD-10-CM | POA: Diagnosis not present

## 2016-03-19 DIAGNOSIS — Z79891 Long term (current) use of opiate analgesic: Secondary | ICD-10-CM | POA: Diagnosis not present

## 2016-03-19 DIAGNOSIS — Z96651 Presence of right artificial knee joint: Secondary | ICD-10-CM | POA: Diagnosis not present

## 2016-03-19 DIAGNOSIS — Z471 Aftercare following joint replacement surgery: Secondary | ICD-10-CM | POA: Diagnosis not present

## 2016-03-22 DIAGNOSIS — Z79891 Long term (current) use of opiate analgesic: Secondary | ICD-10-CM | POA: Diagnosis not present

## 2016-03-22 DIAGNOSIS — Z96651 Presence of right artificial knee joint: Secondary | ICD-10-CM | POA: Diagnosis not present

## 2016-03-22 DIAGNOSIS — I10 Essential (primary) hypertension: Secondary | ICD-10-CM | POA: Diagnosis not present

## 2016-03-22 DIAGNOSIS — K5900 Constipation, unspecified: Secondary | ICD-10-CM | POA: Diagnosis not present

## 2016-03-22 DIAGNOSIS — E119 Type 2 diabetes mellitus without complications: Secondary | ICD-10-CM | POA: Diagnosis not present

## 2016-03-22 DIAGNOSIS — J45909 Unspecified asthma, uncomplicated: Secondary | ICD-10-CM | POA: Diagnosis not present

## 2016-03-22 DIAGNOSIS — Z7901 Long term (current) use of anticoagulants: Secondary | ICD-10-CM | POA: Diagnosis not present

## 2016-03-22 DIAGNOSIS — Z7984 Long term (current) use of oral hypoglycemic drugs: Secondary | ICD-10-CM | POA: Diagnosis not present

## 2016-03-22 DIAGNOSIS — Z471 Aftercare following joint replacement surgery: Secondary | ICD-10-CM | POA: Diagnosis not present

## 2016-03-22 DIAGNOSIS — R112 Nausea with vomiting, unspecified: Secondary | ICD-10-CM | POA: Diagnosis not present

## 2016-03-23 DIAGNOSIS — Z471 Aftercare following joint replacement surgery: Secondary | ICD-10-CM | POA: Diagnosis not present

## 2016-03-23 DIAGNOSIS — Z96651 Presence of right artificial knee joint: Secondary | ICD-10-CM | POA: Insufficient documentation

## 2016-03-23 HISTORY — DX: Presence of right artificial knee joint: Z96.651

## 2016-03-24 DIAGNOSIS — M25561 Pain in right knee: Secondary | ICD-10-CM | POA: Diagnosis not present

## 2016-03-24 DIAGNOSIS — M1711 Unilateral primary osteoarthritis, right knee: Secondary | ICD-10-CM | POA: Diagnosis not present

## 2016-03-24 DIAGNOSIS — R2689 Other abnormalities of gait and mobility: Secondary | ICD-10-CM | POA: Diagnosis not present

## 2016-03-26 DIAGNOSIS — R2689 Other abnormalities of gait and mobility: Secondary | ICD-10-CM | POA: Diagnosis not present

## 2016-03-26 DIAGNOSIS — M25561 Pain in right knee: Secondary | ICD-10-CM | POA: Diagnosis not present

## 2016-03-26 DIAGNOSIS — M1711 Unilateral primary osteoarthritis, right knee: Secondary | ICD-10-CM | POA: Diagnosis not present

## 2016-03-30 DIAGNOSIS — R2689 Other abnormalities of gait and mobility: Secondary | ICD-10-CM | POA: Diagnosis not present

## 2016-03-30 DIAGNOSIS — M25561 Pain in right knee: Secondary | ICD-10-CM | POA: Diagnosis not present

## 2016-03-30 DIAGNOSIS — M1711 Unilateral primary osteoarthritis, right knee: Secondary | ICD-10-CM | POA: Diagnosis not present

## 2016-04-01 DIAGNOSIS — M1711 Unilateral primary osteoarthritis, right knee: Secondary | ICD-10-CM | POA: Diagnosis not present

## 2016-04-01 DIAGNOSIS — R2689 Other abnormalities of gait and mobility: Secondary | ICD-10-CM | POA: Diagnosis not present

## 2016-04-01 DIAGNOSIS — M25561 Pain in right knee: Secondary | ICD-10-CM | POA: Diagnosis not present

## 2016-04-05 DIAGNOSIS — M25561 Pain in right knee: Secondary | ICD-10-CM | POA: Diagnosis not present

## 2016-04-05 DIAGNOSIS — R2689 Other abnormalities of gait and mobility: Secondary | ICD-10-CM | POA: Diagnosis not present

## 2016-04-05 DIAGNOSIS — M1711 Unilateral primary osteoarthritis, right knee: Secondary | ICD-10-CM | POA: Diagnosis not present

## 2016-04-08 DIAGNOSIS — M1711 Unilateral primary osteoarthritis, right knee: Secondary | ICD-10-CM | POA: Diagnosis not present

## 2016-04-08 DIAGNOSIS — R2689 Other abnormalities of gait and mobility: Secondary | ICD-10-CM | POA: Diagnosis not present

## 2016-04-08 DIAGNOSIS — M25561 Pain in right knee: Secondary | ICD-10-CM | POA: Diagnosis not present

## 2016-04-12 DIAGNOSIS — R2689 Other abnormalities of gait and mobility: Secondary | ICD-10-CM | POA: Diagnosis not present

## 2016-04-12 DIAGNOSIS — M25561 Pain in right knee: Secondary | ICD-10-CM | POA: Diagnosis not present

## 2016-04-12 DIAGNOSIS — M1711 Unilateral primary osteoarthritis, right knee: Secondary | ICD-10-CM | POA: Diagnosis not present

## 2016-04-15 DIAGNOSIS — M1711 Unilateral primary osteoarthritis, right knee: Secondary | ICD-10-CM | POA: Diagnosis not present

## 2016-04-15 DIAGNOSIS — R2689 Other abnormalities of gait and mobility: Secondary | ICD-10-CM | POA: Diagnosis not present

## 2016-04-15 DIAGNOSIS — M25561 Pain in right knee: Secondary | ICD-10-CM | POA: Diagnosis not present

## 2016-04-20 DIAGNOSIS — R2689 Other abnormalities of gait and mobility: Secondary | ICD-10-CM | POA: Diagnosis not present

## 2016-04-20 DIAGNOSIS — M25561 Pain in right knee: Secondary | ICD-10-CM | POA: Diagnosis not present

## 2016-04-20 DIAGNOSIS — M1711 Unilateral primary osteoarthritis, right knee: Secondary | ICD-10-CM | POA: Diagnosis not present

## 2016-04-22 DIAGNOSIS — M25561 Pain in right knee: Secondary | ICD-10-CM | POA: Diagnosis not present

## 2016-04-22 DIAGNOSIS — R2689 Other abnormalities of gait and mobility: Secondary | ICD-10-CM | POA: Diagnosis not present

## 2016-04-22 DIAGNOSIS — M1711 Unilateral primary osteoarthritis, right knee: Secondary | ICD-10-CM | POA: Diagnosis not present

## 2016-04-26 DIAGNOSIS — M25561 Pain in right knee: Secondary | ICD-10-CM | POA: Diagnosis not present

## 2016-04-26 DIAGNOSIS — M1711 Unilateral primary osteoarthritis, right knee: Secondary | ICD-10-CM | POA: Diagnosis not present

## 2016-04-26 DIAGNOSIS — R2689 Other abnormalities of gait and mobility: Secondary | ICD-10-CM | POA: Diagnosis not present

## 2016-04-29 DIAGNOSIS — M1711 Unilateral primary osteoarthritis, right knee: Secondary | ICD-10-CM | POA: Diagnosis not present

## 2016-04-29 DIAGNOSIS — R2689 Other abnormalities of gait and mobility: Secondary | ICD-10-CM | POA: Diagnosis not present

## 2016-04-29 DIAGNOSIS — M25561 Pain in right knee: Secondary | ICD-10-CM | POA: Diagnosis not present

## 2016-05-03 DIAGNOSIS — M1711 Unilateral primary osteoarthritis, right knee: Secondary | ICD-10-CM | POA: Diagnosis not present

## 2016-05-03 DIAGNOSIS — M25561 Pain in right knee: Secondary | ICD-10-CM | POA: Diagnosis not present

## 2016-05-03 DIAGNOSIS — R2689 Other abnormalities of gait and mobility: Secondary | ICD-10-CM | POA: Diagnosis not present

## 2016-05-06 DIAGNOSIS — M25561 Pain in right knee: Secondary | ICD-10-CM | POA: Diagnosis not present

## 2016-05-06 DIAGNOSIS — R2689 Other abnormalities of gait and mobility: Secondary | ICD-10-CM | POA: Diagnosis not present

## 2016-05-06 DIAGNOSIS — M1711 Unilateral primary osteoarthritis, right knee: Secondary | ICD-10-CM | POA: Diagnosis not present

## 2016-05-10 DIAGNOSIS — R2689 Other abnormalities of gait and mobility: Secondary | ICD-10-CM | POA: Diagnosis not present

## 2016-05-10 DIAGNOSIS — M25561 Pain in right knee: Secondary | ICD-10-CM | POA: Diagnosis not present

## 2016-05-10 DIAGNOSIS — M1711 Unilateral primary osteoarthritis, right knee: Secondary | ICD-10-CM | POA: Diagnosis not present

## 2016-05-13 DIAGNOSIS — M25561 Pain in right knee: Secondary | ICD-10-CM | POA: Diagnosis not present

## 2016-05-13 DIAGNOSIS — M1711 Unilateral primary osteoarthritis, right knee: Secondary | ICD-10-CM | POA: Diagnosis not present

## 2016-05-13 DIAGNOSIS — R2689 Other abnormalities of gait and mobility: Secondary | ICD-10-CM | POA: Diagnosis not present

## 2016-05-21 DIAGNOSIS — M1711 Unilateral primary osteoarthritis, right knee: Secondary | ICD-10-CM | POA: Diagnosis not present

## 2016-05-21 DIAGNOSIS — M25561 Pain in right knee: Secondary | ICD-10-CM | POA: Diagnosis not present

## 2016-05-21 DIAGNOSIS — R2689 Other abnormalities of gait and mobility: Secondary | ICD-10-CM | POA: Diagnosis not present

## 2016-05-26 DIAGNOSIS — M1711 Unilateral primary osteoarthritis, right knee: Secondary | ICD-10-CM | POA: Diagnosis not present

## 2016-05-26 DIAGNOSIS — R2689 Other abnormalities of gait and mobility: Secondary | ICD-10-CM | POA: Diagnosis not present

## 2016-05-26 DIAGNOSIS — M25561 Pain in right knee: Secondary | ICD-10-CM | POA: Diagnosis not present

## 2016-06-28 DIAGNOSIS — M25561 Pain in right knee: Secondary | ICD-10-CM | POA: Diagnosis not present

## 2016-06-28 DIAGNOSIS — M810 Age-related osteoporosis without current pathological fracture: Secondary | ICD-10-CM | POA: Diagnosis not present

## 2016-06-28 DIAGNOSIS — M461 Sacroiliitis, not elsewhere classified: Secondary | ICD-10-CM | POA: Diagnosis not present

## 2016-06-28 DIAGNOSIS — M1712 Unilateral primary osteoarthritis, left knee: Secondary | ICD-10-CM | POA: Diagnosis not present

## 2016-08-09 DIAGNOSIS — E1169 Type 2 diabetes mellitus with other specified complication: Secondary | ICD-10-CM | POA: Diagnosis not present

## 2016-08-09 DIAGNOSIS — I129 Hypertensive chronic kidney disease with stage 1 through stage 4 chronic kidney disease, or unspecified chronic kidney disease: Secondary | ICD-10-CM | POA: Diagnosis not present

## 2016-08-09 DIAGNOSIS — E782 Mixed hyperlipidemia: Secondary | ICD-10-CM | POA: Diagnosis not present

## 2016-09-02 DIAGNOSIS — Z471 Aftercare following joint replacement surgery: Secondary | ICD-10-CM | POA: Diagnosis not present

## 2016-09-02 DIAGNOSIS — Z96651 Presence of right artificial knee joint: Secondary | ICD-10-CM | POA: Diagnosis not present

## 2016-09-03 ENCOUNTER — Ambulatory Visit: Payer: Self-pay | Admitting: Rheumatology

## 2016-12-16 DIAGNOSIS — Z139 Encounter for screening, unspecified: Secondary | ICD-10-CM | POA: Diagnosis not present

## 2016-12-16 DIAGNOSIS — I129 Hypertensive chronic kidney disease with stage 1 through stage 4 chronic kidney disease, or unspecified chronic kidney disease: Secondary | ICD-10-CM | POA: Diagnosis not present

## 2016-12-16 DIAGNOSIS — E1169 Type 2 diabetes mellitus with other specified complication: Secondary | ICD-10-CM | POA: Diagnosis not present

## 2016-12-16 DIAGNOSIS — Z1389 Encounter for screening for other disorder: Secondary | ICD-10-CM | POA: Diagnosis not present

## 2016-12-16 DIAGNOSIS — Z0000 Encounter for general adult medical examination without abnormal findings: Secondary | ICD-10-CM | POA: Diagnosis not present

## 2016-12-29 DIAGNOSIS — I129 Hypertensive chronic kidney disease with stage 1 through stage 4 chronic kidney disease, or unspecified chronic kidney disease: Secondary | ICD-10-CM | POA: Diagnosis not present

## 2016-12-29 DIAGNOSIS — E1140 Type 2 diabetes mellitus with diabetic neuropathy, unspecified: Secondary | ICD-10-CM | POA: Diagnosis not present

## 2016-12-29 DIAGNOSIS — E1165 Type 2 diabetes mellitus with hyperglycemia: Secondary | ICD-10-CM | POA: Diagnosis not present

## 2016-12-29 DIAGNOSIS — N182 Chronic kidney disease, stage 2 (mild): Secondary | ICD-10-CM | POA: Diagnosis not present

## 2017-02-07 DIAGNOSIS — G8929 Other chronic pain: Secondary | ICD-10-CM

## 2017-02-07 DIAGNOSIS — Z471 Aftercare following joint replacement surgery: Secondary | ICD-10-CM | POA: Diagnosis not present

## 2017-02-07 DIAGNOSIS — Z96651 Presence of right artificial knee joint: Secondary | ICD-10-CM | POA: Diagnosis not present

## 2017-02-07 DIAGNOSIS — M25572 Pain in left ankle and joints of left foot: Secondary | ICD-10-CM | POA: Diagnosis not present

## 2017-02-07 DIAGNOSIS — M25562 Pain in left knee: Secondary | ICD-10-CM | POA: Diagnosis not present

## 2017-02-07 DIAGNOSIS — M1712 Unilateral primary osteoarthritis, left knee: Secondary | ICD-10-CM | POA: Diagnosis not present

## 2017-02-07 HISTORY — DX: Other chronic pain: G89.29

## 2017-02-16 DIAGNOSIS — R5383 Other fatigue: Secondary | ICD-10-CM | POA: Insufficient documentation

## 2017-02-16 DIAGNOSIS — M533 Sacrococcygeal disorders, not elsewhere classified: Secondary | ICD-10-CM

## 2017-02-16 DIAGNOSIS — M797 Fibromyalgia: Secondary | ICD-10-CM | POA: Insufficient documentation

## 2017-02-16 DIAGNOSIS — G8929 Other chronic pain: Secondary | ICD-10-CM | POA: Insufficient documentation

## 2017-02-16 DIAGNOSIS — F5101 Primary insomnia: Secondary | ICD-10-CM | POA: Insufficient documentation

## 2017-02-16 HISTORY — DX: Other chronic pain: G89.29

## 2017-02-16 HISTORY — DX: Primary insomnia: F51.01

## 2017-02-16 HISTORY — DX: Other fatigue: R53.83

## 2017-02-16 HISTORY — DX: Sacrococcygeal disorders, not elsewhere classified: M53.3

## 2017-02-16 NOTE — Progress Notes (Signed)
Office Visit Note  Patient: Kristin Flores             Date of Birth: 04-01-1946           MRN: [id number]             PCP: Marco Collie, MD Referring: Marco Collie, MD Visit Date: 02/22/2017 Occupation: @GUAROCC @    Subjective:  Left foot pain and bilateral feet swelling   History of Present Illness: Kristin Flores is a 71 y.o. female with history of fibromyalgia and osteoarthritis. She had left ankle joint fusion few years ago after crush injury to her left ankle. She states lately her left ankle has been hurting more. She also has noticed increased fluid retention in bilateral lower extremities. Her right total knee replacement is doing well. She continues to have some generalized pain from fibromyalgia. She also reports some lower back pain.  Activities of Daily Living:  Patient reports morning stiffness for hour.   Patient Reports nocturnal pain.  Difficulty dressing/grooming: Reports Difficulty climbing stairs: Denies Difficulty getting out of chair: Reports Difficulty using hands for taps, buttons, cutlery, and/or writing: Denies   Review of Systems  Constitutional: Positive for fatigue. Negative for night sweats, weight gain, weight loss and weakness.  HENT: Positive for mouth dryness. Negative for mouth sores, trouble swallowing, trouble swallowing and nose dryness.   Eyes: Negative for pain, redness, visual disturbance and dryness.  Respiratory: Negative for cough, shortness of breath and difficulty breathing.   Cardiovascular: Negative for chest pain, palpitations, hypertension, irregular heartbeat and swelling in legs/feet.  Gastrointestinal: Negative for blood in stool, constipation and diarrhea.  Endocrine: Negative for increased urination.  Genitourinary: Negative for vaginal dryness.  Musculoskeletal: Positive for arthralgias, joint pain, myalgias, morning stiffness and myalgias. Negative for joint swelling, muscle weakness and muscle tenderness.  Skin: Negative  for color change, rash, hair loss, skin tightness, ulcers and sensitivity to sunlight.  Allergic/Immunologic: Negative for susceptible to infections.  Neurological: Negative for dizziness, memory loss and night sweats.  Hematological: Negative for swollen glands.  Psychiatric/Behavioral: Positive for depressed mood and sleep disturbance. The patient is nervous/anxious.     PMFS History:  Patient Active Problem List   Diagnosis Date Noted  . Fibromyalgia 02/16/2017  . Other fatigue 02/16/2017  . Primary insomnia 02/16/2017  . Chronic left SI joint pain 02/16/2017  . S/P total knee replacement 03/08/2016    Past Medical History:  Diagnosis Date  . Asthma   . Constipation   . 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 Fibromyalgia   . Hypertension   . Osteoarthritis     History reviewed. No pertinent family history. Past Surgical History:  Procedure Laterality Date  . ANKLE FUSION Left   . CHOLECYSTECTOMY    . COLONOSCOPY    . TOTAL KNEE ARTHROPLASTY Right 03/08/2016   Procedure: TOTAL KNEE ARTHROPLASTY;  Surgeon: Vickey Huger, MD;  Location: [facility];  Service: Orthopedics;  Laterality: Right;   Social History   Social History Narrative  . No narrative on file     Objective: Vital Signs: BP (!) 146/85 (BP Location: Left Arm, Patient Position: Sitting, Cuff Size: Normal)   Pulse 82   Resp 16   Ht 5\' 5"  (1.651 m)   Wt 256 lb (116.1 kg)   BMI 42.60 kg/m    Physical Exam  Constitutional: She is oriented to person, place, and time. She appears well-developed and well-nourished.  HENT:  Head: Normocephalic and atraumatic.  Eyes: Conjunctivae and EOM are normal.  Neck: Normal range of motion.  Cardiovascular: Normal rate, regular rhythm, normal heart sounds and intact distal pulses.   Pitting edema on bilateral lower extremities  Pulmonary/Chest: Effort normal and breath sounds normal.  Abdominal: Soft. Bowel sounds are normal.  Lymphadenopathy:     She has no cervical adenopathy.  Neurological: She is alert and oriented to person, place, and time.  Skin: Skin is warm and dry. Capillary refill takes less than 2 seconds.  Psychiatric: She has a normal mood and affect. Her behavior is normal.  Nursing note and vitals reviewed.    Musculoskeletal Exam: C-spine and thoracic lumbar spine good range of motion. Shoulder joints elbow joints wrist joints are good range of motion. She has no MCP PIP/DIP thickening or synovitis. Her right total knee replacement appears to be doing well. She had bilateral lower extremity pitting edema until depression area. She has no warmth or swelling over her ankle joints. CDAI Exam: No CDAI exam completed.    Investigation: No additional findings.  Imaging: No results found.  Speciality Comments: No specialty comments available.    Procedures:  No procedures performed Allergies: Codeine   Assessment / Plan:     Visit Diagnoses: Fibromyalgia: She continues to have generalized pain myalgias and positive tender points.  Other fatigue: Secondary to insomnia  Primary insomnia: She continues to have some insomnia  Chronic pain of left ankle - History of left ankle joint fusion after crush injury by Dr. Clyde Canterbury. She's been having marked discomfort in her left ankle joint. She states Dr. Lorre Nick did x-rays which were negative. I do not see any warmth on palpation I believe her symptoms are coming from underlying edema. I've advised her to contact her PCP and see if she can get rid of some of the fluid retention. I also had detailed discussion regarding compression hose. If her symptoms do not improve then she should follow up with Dr. Clyde Canterbury.. Detailed discussion regarding being active and also reducing salt intake.  Status post total right knee replacement: Doing well  Chronic left SI joint pain: She continues to have some discomfort is tolerable  Age-related osteoporosis without current pathological  fracture - The 06/12/2015 DEXA shows a T-score of negative 2.5 . She will follow-up with PCP regarding that Orders: No orders of the defined types were placed in this encounter.  No orders of the defined types were placed in this encounter.   Face-to-face time spent with patient was 30 minutes. 50% of time was spent in counseling and coordination of care.  Follow-Up Instructions: Return if symptoms worsen or fail to improve, for Osteoarthritis FMS.   Bo Merino, MD  Note - This record has been created using Editor, commissioning.  Chart creation errors have been sought, but may not always  have been located. Such creation errors do not reflect on  the standard of medical care.

## 2017-02-22 ENCOUNTER — Ambulatory Visit (INDEPENDENT_AMBULATORY_CARE_PROVIDER_SITE_OTHER): Payer: Medicare Other | Admitting: Rheumatology

## 2017-02-22 ENCOUNTER — Encounter: Payer: Self-pay | Admitting: Rheumatology

## 2017-02-22 VITALS — BP 146/85 | HR 82 | Resp 16 | Ht 65.0 in | Wt 256.0 lb

## 2017-02-22 DIAGNOSIS — F5101 Primary insomnia: Secondary | ICD-10-CM

## 2017-02-22 DIAGNOSIS — Z96651 Presence of right artificial knee joint: Secondary | ICD-10-CM

## 2017-02-22 DIAGNOSIS — M810 Age-related osteoporosis without current pathological fracture: Secondary | ICD-10-CM | POA: Diagnosis not present

## 2017-02-22 DIAGNOSIS — R5383 Other fatigue: Secondary | ICD-10-CM | POA: Diagnosis not present

## 2017-02-22 DIAGNOSIS — M25572 Pain in left ankle and joints of left foot: Secondary | ICD-10-CM

## 2017-02-22 DIAGNOSIS — G8929 Other chronic pain: Secondary | ICD-10-CM | POA: Diagnosis not present

## 2017-02-22 DIAGNOSIS — M533 Sacrococcygeal disorders, not elsewhere classified: Secondary | ICD-10-CM | POA: Diagnosis not present

## 2017-02-22 DIAGNOSIS — M797 Fibromyalgia: Secondary | ICD-10-CM | POA: Diagnosis not present

## 2017-02-23 DIAGNOSIS — R600 Localized edema: Secondary | ICD-10-CM | POA: Diagnosis not present

## 2017-03-08 DIAGNOSIS — Z1231 Encounter for screening mammogram for malignant neoplasm of breast: Secondary | ICD-10-CM | POA: Diagnosis not present

## 2017-03-22 DIAGNOSIS — I129 Hypertensive chronic kidney disease with stage 1 through stage 4 chronic kidney disease, or unspecified chronic kidney disease: Secondary | ICD-10-CM | POA: Diagnosis not present

## 2017-03-22 DIAGNOSIS — E1169 Type 2 diabetes mellitus with other specified complication: Secondary | ICD-10-CM | POA: Diagnosis not present

## 2017-04-04 DIAGNOSIS — M19072 Primary osteoarthritis, left ankle and foot: Secondary | ICD-10-CM | POA: Diagnosis not present

## 2017-04-04 DIAGNOSIS — Z981 Arthrodesis status: Secondary | ICD-10-CM | POA: Diagnosis not present

## 2017-04-04 DIAGNOSIS — M25572 Pain in left ankle and joints of left foot: Secondary | ICD-10-CM | POA: Diagnosis not present

## 2017-04-04 HISTORY — DX: Arthrodesis status: Z98.1

## 2017-04-13 DIAGNOSIS — E1140 Type 2 diabetes mellitus with diabetic neuropathy, unspecified: Secondary | ICD-10-CM | POA: Diagnosis not present

## 2017-04-13 DIAGNOSIS — Z1389 Encounter for screening for other disorder: Secondary | ICD-10-CM | POA: Diagnosis not present

## 2017-04-13 DIAGNOSIS — I129 Hypertensive chronic kidney disease with stage 1 through stage 4 chronic kidney disease, or unspecified chronic kidney disease: Secondary | ICD-10-CM | POA: Diagnosis not present

## 2017-04-13 DIAGNOSIS — E1165 Type 2 diabetes mellitus with hyperglycemia: Secondary | ICD-10-CM | POA: Diagnosis not present

## 2017-04-13 DIAGNOSIS — N182 Chronic kidney disease, stage 2 (mild): Secondary | ICD-10-CM | POA: Diagnosis not present

## 2017-04-13 DIAGNOSIS — Z139 Encounter for screening, unspecified: Secondary | ICD-10-CM | POA: Diagnosis not present

## 2017-04-13 DIAGNOSIS — Z789 Other specified health status: Secondary | ICD-10-CM | POA: Diagnosis not present

## 2017-04-24 IMAGING — CR DG CHEST 2V
2 series · 2 of 2 positions shown · non-contrast
Comparison: None.

CLINICAL DATA: Preop for right total knee replacement.

EXAM:
CHEST  2 VIEW

[w chest pa]
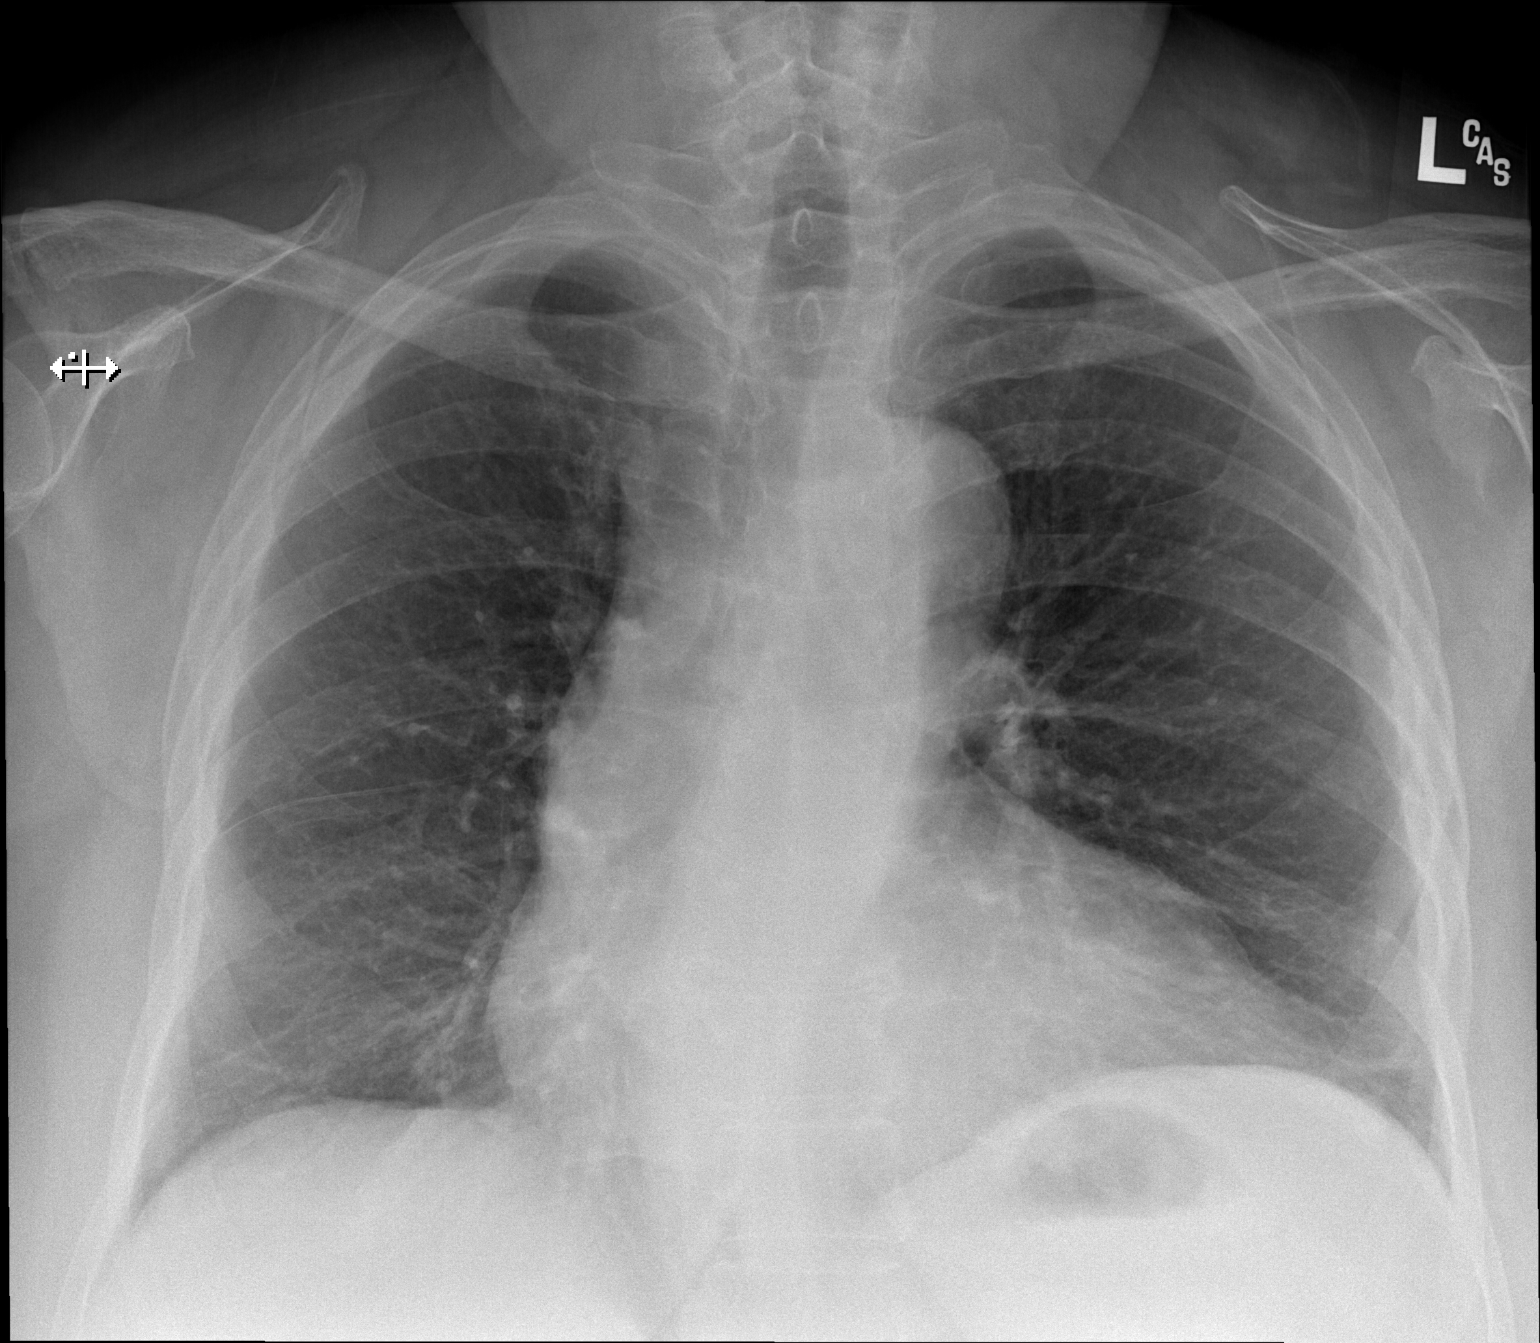

[w chest lat]
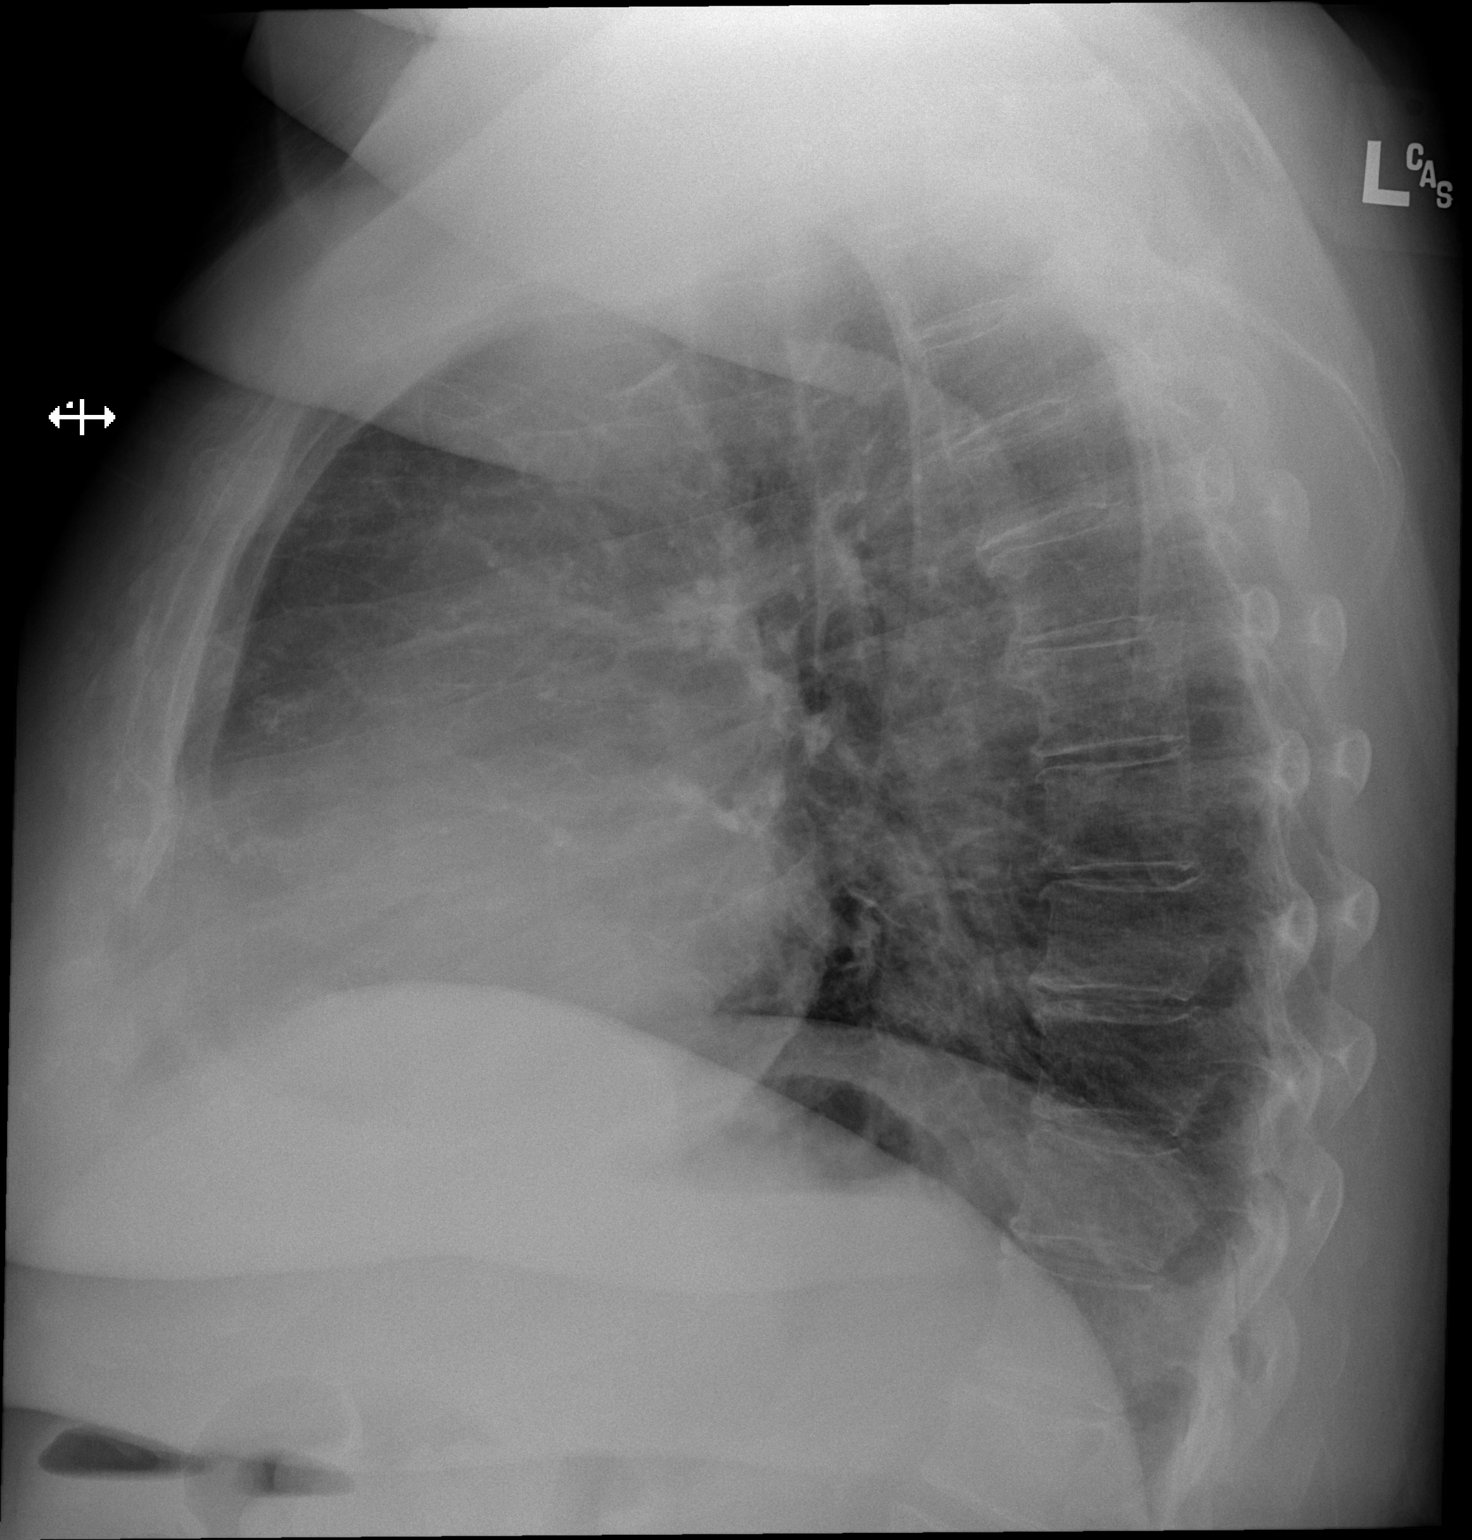

[2 of 2 positions shown; findings below may reference images not displayed]

FINDINGS: The heart size and mediastinal contours are within normal limits.
Both lungs are clear. The visualized skeletal structures are
unremarkable.
IMPRESSION: No active cardiopulmonary disease.

## 2017-05-16 DIAGNOSIS — Z789 Other specified health status: Secondary | ICD-10-CM | POA: Diagnosis not present

## 2017-05-16 DIAGNOSIS — M797 Fibromyalgia: Secondary | ICD-10-CM | POA: Diagnosis not present

## 2017-05-16 DIAGNOSIS — E782 Mixed hyperlipidemia: Secondary | ICD-10-CM | POA: Diagnosis not present

## 2017-05-16 DIAGNOSIS — E1169 Type 2 diabetes mellitus with other specified complication: Secondary | ICD-10-CM | POA: Diagnosis not present

## 2017-06-20 DIAGNOSIS — L820 Inflamed seborrheic keratosis: Secondary | ICD-10-CM | POA: Diagnosis not present

## 2017-06-27 DIAGNOSIS — M5432 Sciatica, left side: Secondary | ICD-10-CM

## 2017-06-27 HISTORY — DX: Sciatica, left side: M54.32

## 2017-07-05 DIAGNOSIS — Z885 Allergy status to narcotic agent status: Secondary | ICD-10-CM | POA: Diagnosis not present

## 2017-07-05 DIAGNOSIS — M25572 Pain in left ankle and joints of left foot: Secondary | ICD-10-CM | POA: Diagnosis not present

## 2017-07-05 DIAGNOSIS — Z8781 Personal history of (healed) traumatic fracture: Secondary | ICD-10-CM | POA: Diagnosis not present

## 2017-07-05 DIAGNOSIS — J45909 Unspecified asthma, uncomplicated: Secondary | ICD-10-CM | POA: Diagnosis not present

## 2017-07-05 DIAGNOSIS — Z981 Arthrodesis status: Secondary | ICD-10-CM | POA: Diagnosis not present

## 2017-07-05 DIAGNOSIS — Z79899 Other long term (current) drug therapy: Secondary | ICD-10-CM | POA: Diagnosis not present

## 2017-07-05 DIAGNOSIS — E785 Hyperlipidemia, unspecified: Secondary | ICD-10-CM | POA: Diagnosis not present

## 2017-07-05 DIAGNOSIS — I10 Essential (primary) hypertension: Secondary | ICD-10-CM | POA: Diagnosis not present

## 2017-07-05 DIAGNOSIS — Z8739 Personal history of other diseases of the musculoskeletal system and connective tissue: Secondary | ICD-10-CM | POA: Diagnosis not present

## 2017-07-14 DIAGNOSIS — I129 Hypertensive chronic kidney disease with stage 1 through stage 4 chronic kidney disease, or unspecified chronic kidney disease: Secondary | ICD-10-CM | POA: Diagnosis not present

## 2017-07-14 DIAGNOSIS — E1169 Type 2 diabetes mellitus with other specified complication: Secondary | ICD-10-CM | POA: Diagnosis not present

## 2017-07-25 DIAGNOSIS — E1169 Type 2 diabetes mellitus with other specified complication: Secondary | ICD-10-CM | POA: Diagnosis not present

## 2017-07-25 DIAGNOSIS — I129 Hypertensive chronic kidney disease with stage 1 through stage 4 chronic kidney disease, or unspecified chronic kidney disease: Secondary | ICD-10-CM | POA: Diagnosis not present

## 2017-07-25 DIAGNOSIS — N182 Chronic kidney disease, stage 2 (mild): Secondary | ICD-10-CM | POA: Diagnosis not present

## 2017-07-25 DIAGNOSIS — E782 Mixed hyperlipidemia: Secondary | ICD-10-CM | POA: Diagnosis not present

## 2017-07-26 DIAGNOSIS — E1165 Type 2 diabetes mellitus with hyperglycemia: Secondary | ICD-10-CM | POA: Diagnosis not present

## 2017-07-26 DIAGNOSIS — E1140 Type 2 diabetes mellitus with diabetic neuropathy, unspecified: Secondary | ICD-10-CM | POA: Diagnosis not present

## 2017-08-11 DIAGNOSIS — E1165 Type 2 diabetes mellitus with hyperglycemia: Secondary | ICD-10-CM | POA: Diagnosis not present

## 2017-08-11 DIAGNOSIS — E1140 Type 2 diabetes mellitus with diabetic neuropathy, unspecified: Secondary | ICD-10-CM | POA: Diagnosis not present

## 2017-08-31 DIAGNOSIS — M545 Low back pain: Secondary | ICD-10-CM | POA: Diagnosis not present

## 2017-08-31 DIAGNOSIS — M5416 Radiculopathy, lumbar region: Secondary | ICD-10-CM | POA: Diagnosis not present

## 2017-09-02 DIAGNOSIS — M545 Low back pain: Secondary | ICD-10-CM | POA: Diagnosis not present

## 2017-09-02 DIAGNOSIS — M5416 Radiculopathy, lumbar region: Secondary | ICD-10-CM | POA: Diagnosis not present

## 2017-09-05 DIAGNOSIS — E1140 Type 2 diabetes mellitus with diabetic neuropathy, unspecified: Secondary | ICD-10-CM | POA: Diagnosis not present

## 2017-09-05 DIAGNOSIS — E1165 Type 2 diabetes mellitus with hyperglycemia: Secondary | ICD-10-CM | POA: Diagnosis not present

## 2017-10-11 DIAGNOSIS — M19072 Primary osteoarthritis, left ankle and foot: Secondary | ICD-10-CM

## 2017-10-11 HISTORY — DX: Primary osteoarthritis, left ankle and foot: M19.072

## 2017-10-17 DIAGNOSIS — J22 Unspecified acute lower respiratory infection: Secondary | ICD-10-CM | POA: Diagnosis not present

## 2017-10-17 DIAGNOSIS — R05 Cough: Secondary | ICD-10-CM | POA: Diagnosis not present

## 2017-10-24 DIAGNOSIS — E1169 Type 2 diabetes mellitus with other specified complication: Secondary | ICD-10-CM | POA: Diagnosis not present

## 2017-10-24 DIAGNOSIS — I129 Hypertensive chronic kidney disease with stage 1 through stage 4 chronic kidney disease, or unspecified chronic kidney disease: Secondary | ICD-10-CM | POA: Diagnosis not present

## 2017-10-31 DIAGNOSIS — E782 Mixed hyperlipidemia: Secondary | ICD-10-CM | POA: Diagnosis not present

## 2017-10-31 DIAGNOSIS — E1169 Type 2 diabetes mellitus with other specified complication: Secondary | ICD-10-CM | POA: Diagnosis not present

## 2017-10-31 DIAGNOSIS — I129 Hypertensive chronic kidney disease with stage 1 through stage 4 chronic kidney disease, or unspecified chronic kidney disease: Secondary | ICD-10-CM | POA: Diagnosis not present

## 2017-10-31 DIAGNOSIS — N182 Chronic kidney disease, stage 2 (mild): Secondary | ICD-10-CM | POA: Diagnosis not present

## 2017-11-02 DIAGNOSIS — H2513 Age-related nuclear cataract, bilateral: Secondary | ICD-10-CM | POA: Diagnosis not present

## 2017-11-02 DIAGNOSIS — E119 Type 2 diabetes mellitus without complications: Secondary | ICD-10-CM | POA: Diagnosis not present

## 2017-11-02 DIAGNOSIS — Z7984 Long term (current) use of oral hypoglycemic drugs: Secondary | ICD-10-CM | POA: Diagnosis not present

## 2017-11-03 DIAGNOSIS — E1165 Type 2 diabetes mellitus with hyperglycemia: Secondary | ICD-10-CM | POA: Diagnosis not present

## 2017-11-03 DIAGNOSIS — E1140 Type 2 diabetes mellitus with diabetic neuropathy, unspecified: Secondary | ICD-10-CM | POA: Diagnosis not present

## 2017-11-30 DIAGNOSIS — R109 Unspecified abdominal pain: Secondary | ICD-10-CM | POA: Diagnosis not present

## 2017-11-30 DIAGNOSIS — E119 Type 2 diabetes mellitus without complications: Secondary | ICD-10-CM | POA: Diagnosis not present

## 2017-12-02 DIAGNOSIS — R109 Unspecified abdominal pain: Secondary | ICD-10-CM | POA: Diagnosis not present

## 2017-12-02 DIAGNOSIS — K5730 Diverticulosis of large intestine without perforation or abscess without bleeding: Secondary | ICD-10-CM | POA: Diagnosis not present

## 2017-12-14 DIAGNOSIS — E559 Vitamin D deficiency, unspecified: Secondary | ICD-10-CM | POA: Diagnosis not present

## 2017-12-14 DIAGNOSIS — Z1331 Encounter for screening for depression: Secondary | ICD-10-CM | POA: Diagnosis not present

## 2017-12-14 DIAGNOSIS — R5383 Other fatigue: Secondary | ICD-10-CM | POA: Diagnosis not present

## 2017-12-14 DIAGNOSIS — E119 Type 2 diabetes mellitus without complications: Secondary | ICD-10-CM | POA: Diagnosis not present

## 2017-12-14 DIAGNOSIS — K5792 Diverticulitis of intestine, part unspecified, without perforation or abscess without bleeding: Secondary | ICD-10-CM | POA: Diagnosis not present

## 2017-12-14 DIAGNOSIS — E785 Hyperlipidemia, unspecified: Secondary | ICD-10-CM | POA: Diagnosis not present

## 2017-12-14 DIAGNOSIS — Z9181 History of falling: Secondary | ICD-10-CM | POA: Diagnosis not present

## 2017-12-14 DIAGNOSIS — I10 Essential (primary) hypertension: Secondary | ICD-10-CM | POA: Diagnosis not present

## 2017-12-14 DIAGNOSIS — Z139 Encounter for screening, unspecified: Secondary | ICD-10-CM | POA: Diagnosis not present

## 2018-01-25 DIAGNOSIS — Z9181 History of falling: Secondary | ICD-10-CM | POA: Diagnosis not present

## 2018-01-25 DIAGNOSIS — Z0000 Encounter for general adult medical examination without abnormal findings: Secondary | ICD-10-CM | POA: Diagnosis not present

## 2018-01-25 DIAGNOSIS — E785 Hyperlipidemia, unspecified: Secondary | ICD-10-CM | POA: Diagnosis not present

## 2018-01-25 DIAGNOSIS — N959 Unspecified menopausal and perimenopausal disorder: Secondary | ICD-10-CM | POA: Diagnosis not present

## 2018-01-25 DIAGNOSIS — Z1231 Encounter for screening mammogram for malignant neoplasm of breast: Secondary | ICD-10-CM | POA: Diagnosis not present

## 2018-01-25 DIAGNOSIS — Z1211 Encounter for screening for malignant neoplasm of colon: Secondary | ICD-10-CM | POA: Diagnosis not present

## 2018-01-25 DIAGNOSIS — Z1331 Encounter for screening for depression: Secondary | ICD-10-CM | POA: Diagnosis not present

## 2018-03-02 DIAGNOSIS — M25572 Pain in left ankle and joints of left foot: Secondary | ICD-10-CM | POA: Diagnosis not present

## 2018-03-02 DIAGNOSIS — G8929 Other chronic pain: Secondary | ICD-10-CM | POA: Diagnosis not present

## 2018-03-02 DIAGNOSIS — M19072 Primary osteoarthritis, left ankle and foot: Secondary | ICD-10-CM | POA: Diagnosis not present

## 2018-03-15 DIAGNOSIS — Z79899 Other long term (current) drug therapy: Secondary | ICD-10-CM | POA: Diagnosis not present

## 2018-03-15 DIAGNOSIS — E119 Type 2 diabetes mellitus without complications: Secondary | ICD-10-CM | POA: Diagnosis not present

## 2018-03-15 DIAGNOSIS — E538 Deficiency of other specified B group vitamins: Secondary | ICD-10-CM | POA: Diagnosis not present

## 2018-03-15 DIAGNOSIS — E559 Vitamin D deficiency, unspecified: Secondary | ICD-10-CM | POA: Diagnosis not present

## 2018-03-15 DIAGNOSIS — I10 Essential (primary) hypertension: Secondary | ICD-10-CM | POA: Diagnosis not present

## 2018-03-15 DIAGNOSIS — E785 Hyperlipidemia, unspecified: Secondary | ICD-10-CM | POA: Diagnosis not present

## 2018-03-17 DIAGNOSIS — M810 Age-related osteoporosis without current pathological fracture: Secondary | ICD-10-CM | POA: Diagnosis not present

## 2018-03-17 DIAGNOSIS — M8589 Other specified disorders of bone density and structure, multiple sites: Secondary | ICD-10-CM | POA: Diagnosis not present

## 2018-03-17 DIAGNOSIS — Z1231 Encounter for screening mammogram for malignant neoplasm of breast: Secondary | ICD-10-CM | POA: Diagnosis not present

## 2018-03-17 DIAGNOSIS — M85852 Other specified disorders of bone density and structure, left thigh: Secondary | ICD-10-CM | POA: Diagnosis not present

## 2018-06-08 DIAGNOSIS — M19072 Primary osteoarthritis, left ankle and foot: Secondary | ICD-10-CM | POA: Diagnosis not present

## 2018-06-08 DIAGNOSIS — M25572 Pain in left ankle and joints of left foot: Secondary | ICD-10-CM | POA: Diagnosis not present

## 2018-06-08 DIAGNOSIS — G8929 Other chronic pain: Secondary | ICD-10-CM | POA: Diagnosis not present

## 2018-06-20 DIAGNOSIS — E559 Vitamin D deficiency, unspecified: Secondary | ICD-10-CM | POA: Diagnosis not present

## 2018-06-20 DIAGNOSIS — R5383 Other fatigue: Secondary | ICD-10-CM | POA: Diagnosis not present

## 2018-06-20 DIAGNOSIS — E785 Hyperlipidemia, unspecified: Secondary | ICD-10-CM | POA: Diagnosis not present

## 2018-06-20 DIAGNOSIS — I10 Essential (primary) hypertension: Secondary | ICD-10-CM | POA: Diagnosis not present

## 2018-06-20 DIAGNOSIS — E538 Deficiency of other specified B group vitamins: Secondary | ICD-10-CM | POA: Diagnosis not present

## 2018-06-20 DIAGNOSIS — E119 Type 2 diabetes mellitus without complications: Secondary | ICD-10-CM | POA: Diagnosis not present

## 2018-06-20 DIAGNOSIS — Z23 Encounter for immunization: Secondary | ICD-10-CM | POA: Diagnosis not present

## 2018-07-13 NOTE — Progress Notes (Signed)
Office Visit Note  Patient: Kristin Flores             Date of Birth: [birth year]           MRN: [id number]             PCP: Nicoletta Dress, MD Referring: Marco Collie, MD Visit Date: 07/27/2018 Occupation: @GUAROCC @  Subjective:  Lower back pain and ankle pain.   History of Present Illness: Kristin Flores is a 72 y.o. female history of osteoarthritis and fibromyalgia syndrome.  She returns after last visit in May 2018.  According to her she has been experiencing increased generalized pain.  She also has a lot of lower back discomfort.  She denies any radiculopathy.  She also has discomfort in her left ankle where she had crush injury in the past.  She was seen by Dr. Clyde Canterbury who recommended surgery but did not assure complete resolution of discomfort.  Right total knee replacement is doing well.  Activities of Daily Living:  Patient reports morning stiffness for 1 hour.   Patient Reports nocturnal pain.  Difficulty dressing/grooming: Reports Difficulty climbing stairs: Reports Difficulty getting out of chair: Reports Difficulty using hands for taps, buttons, cutlery, and/or writing: Denies  Review of Systems  Constitutional: Positive for fatigue. Negative for night sweats, weight gain and weight loss.  HENT: Positive for mouth dryness. Negative for mouth sores, trouble swallowing, trouble swallowing and nose dryness.   Eyes: Negative for pain, redness, itching, visual disturbance and dryness.  Respiratory: Positive for shortness of breath. Negative for cough and difficulty breathing.        With activity   Cardiovascular: Negative for chest pain, palpitations, hypertension, irregular heartbeat and swelling in legs/feet.  Gastrointestinal: Negative for abdominal pain, blood in stool, constipation, diarrhea, nausea and vomiting.  Endocrine: Negative for increased urination.  Genitourinary: Negative for painful urination, nocturia, pelvic pain and vaginal dryness.    Musculoskeletal: Positive for arthralgias, joint pain, joint swelling and morning stiffness. Negative for myalgias, muscle weakness, muscle tenderness and myalgias.  Skin: Negative for color change, rash, hair loss, skin tightness, ulcers and sensitivity to sunlight.  Allergic/Immunologic: Negative for susceptible to infections.  Neurological: Positive for dizziness and memory loss. Negative for light-headedness, numbness, headaches, night sweats and weakness.  Hematological: Negative for bruising/bleeding tendency and swollen glands.  Psychiatric/Behavioral: Negative for depressed mood, confusion and sleep disturbance. The patient is not nervous/anxious.     PMFS History:  Patient Active Problem List   Diagnosis Date Noted  . Fibromyalgia 02/16/2017  . Other fatigue 02/16/2017  . Primary insomnia 02/16/2017  . Chronic left SI joint pain 02/16/2017  . S/P total knee replacement 03/08/2016    Past Medical History:  Diagnosis Date  . Asthma   . Constipation   . 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 Fibromyalgia   . Hypertension   . Osteoarthritis     Family History  Problem Relation Age of Onset  . COPD Brother   . Heart Problems Brother    Past Surgical History:  Procedure Laterality Date  . ANKLE FUSION Left   . CHOLECYSTECTOMY    . COLONOSCOPY    . TOTAL KNEE ARTHROPLASTY Right 03/08/2016   Procedure: TOTAL KNEE ARTHROPLASTY;  Surgeon: Vickey Huger, MD;  Location: [facility];  Service: Orthopedics;  Laterality: Right;   Social History   Social History Narrative  . Not on file    Objective: Vital Signs: BP (!) 158/88 (BP Location: Left  Wrist, Patient Position: Sitting, Cuff Size: Normal)   Pulse 75   Resp 16   Ht 5\' 3"  (1.6 m)   Wt 251 lb (113.9 kg)   BMI 44.46 kg/m    Physical Exam  Constitutional: She is oriented to person, place, and time. She appears well-developed and well-nourished.  HENT:  Head: Normocephalic and atraumatic.  Eyes:  Conjunctivae and EOM are normal.  Neck: Normal range of motion.  Cardiovascular: Normal rate, regular rhythm, normal heart sounds and intact distal pulses.  Pulmonary/Chest: Effort normal and breath sounds normal.  Abdominal: Soft. Bowel sounds are normal.  Lymphadenopathy:    She has no cervical adenopathy.  Neurological: She is alert and oriented to person, place, and time.  Skin: Skin is warm and dry. Capillary refill takes less than 2 seconds.  Psychiatric: She has a normal mood and affect. Her behavior is normal.  Nursing note and vitals reviewed.    Musculoskeletal Exam: Patient has limited range of motion of cervical, thoracic and lumbar spine.  She has tenderness on palpation of her thoracic and lumbar region.  Shoulder joints elbow joints wrist joint MCPs PIPs DIPs were in good range of motion with no synovitis.  She had painful range of motion of her left ankle with tenderness on palpation.  Right total knee replacement is doing well.  No synovitis was noted on examination.  She has generalized hyperalgesia and positive tender points.  CDAI Exam: CDAI Score: Not documented Patient Global Assessment: Not documented; Provider Global Assessment: Not documented Swollen: Not documented; Tender: Not documented Joint Exam   Not documented   There is currently no information documented on the homunculus. Go to the Rheumatology activity and complete the homunculus joint exam.  Investigation: No additional findings.  Imaging: Xr Thoracic Spine 2 View  Result Date: 07/27/2018 Mild multilevel spondylosis was noted.  Thoracic kyphosis was noted.  Mild facet joint arthropathy was noted.  No spondylosis was noted.  Xr Lumbar Spine 2-3 Views  Result Date: 07/27/2018 Multilevel spondylosis was noted.  L2-3, L3-4 narrowing was noted.  L4-L5 spondylolisthesis was noted.  No SI joint sclerosis was noted.  Mild facet joint arthropathy was noted.   Recent Labs: Lab Results  Component  Value Date   WBC 11.6 (H) 03/10/2016   HGB 11.1 (L) 03/10/2016   PLT 279 03/10/2016   NA 138 03/09/2016   K 4.7 03/09/2016   CL 101 03/09/2016   CO2 27 03/09/2016   GLUCOSE 152 (H) 03/09/2016   BUN 15 03/09/2016   CREATININE 1.02 (H) 03/09/2016   BILITOT 0.4 02/25/2016   ALKPHOS 65 02/25/2016   AST 27 02/25/2016   ALT 24 02/25/2016   PROT 6.9 02/25/2016   ALBUMIN 3.7 02/25/2016   CALCIUM 9.2 03/09/2016   GFRAA >60 03/09/2016    Speciality Comments: No specialty comments available.  Procedures:  No procedures performed Allergies: Codeine   Assessment / Plan:     Visit Diagnoses: Pain in thoracic spine -patient complains of increased thoracic pain with no radiculopathy.  Plan: XR Thoracic Spine 2 View.  The x-ray of the thoracic spine was consistent with mild spondylosis and facet joint arthropathy.  Pain of lumbar spine -she has been having constant lower back pain without radiculopathy.  Plan: XR Lumbar Spine 2-3 Views.  The x-ray of the lumbar spine was consistent with spondylosis and facet joint arthropathy.  I have given patient a handout on lumbar spine exercises.  Weight loss diet and exercise was discussed.  I  have also referred her to physical therapy.  Prescription was given.  I have encouraged water aerobics and swimming.  Fibromyalgia-she has generalized pain discomfort and positive tender points.  Status post total right knee replacement-doing well.  Chronic pain of left ankle -  History of left ankle joint fusion after crush injury by Dr. Clyde Canterbury.  Patient continues to have left ankle swelling and discomfort.  She states Dr. Glenna Fellows recommend a repeat surgery.  Primary insomnia-she has chronic insomnia.  Good sleep hygiene was discussed.  Other fatigue-related to insomnia.  Age-related osteoporosis without current pathological fracture - 06/12/2015 DEXA shows a T-score of negative 2.5 . She will follow-up with PCP.  Orders: Orders Placed This Encounter    Procedures  . XR Thoracic Spine 2 View  . XR Lumbar Spine 2-3 Views   No orders of the defined types were placed in this encounter.   Face-to-face time spent with patient was 30 minutes. Greater than 50% of time was spent in counseling and coordination of care.  Follow-Up Instructions: Return if symptoms worsen or fail to improve, for FMS, OA,.   Bo Merino, MD  Note - This record has been created using Editor, commissioning.  Chart creation errors have been sought, but may not always  have been located. Such creation errors do not reflect on  the standard of medical care.

## 2018-07-27 ENCOUNTER — Encounter (INDEPENDENT_AMBULATORY_CARE_PROVIDER_SITE_OTHER): Payer: Self-pay

## 2018-07-27 ENCOUNTER — Ambulatory Visit (INDEPENDENT_AMBULATORY_CARE_PROVIDER_SITE_OTHER): Payer: Self-pay

## 2018-07-27 ENCOUNTER — Encounter: Payer: Self-pay | Admitting: Rheumatology

## 2018-07-27 ENCOUNTER — Ambulatory Visit: Payer: Medicare Other | Admitting: Rheumatology

## 2018-07-27 VITALS — BP 158/88 | HR 75 | Resp 16 | Ht 63.0 in | Wt 251.0 lb

## 2018-07-27 DIAGNOSIS — M797 Fibromyalgia: Secondary | ICD-10-CM | POA: Diagnosis not present

## 2018-07-27 DIAGNOSIS — R5383 Other fatigue: Secondary | ICD-10-CM

## 2018-07-27 DIAGNOSIS — M810 Age-related osteoporosis without current pathological fracture: Secondary | ICD-10-CM

## 2018-07-27 DIAGNOSIS — M25572 Pain in left ankle and joints of left foot: Secondary | ICD-10-CM

## 2018-07-27 DIAGNOSIS — M545 Low back pain, unspecified: Secondary | ICD-10-CM

## 2018-07-27 DIAGNOSIS — M546 Pain in thoracic spine: Secondary | ICD-10-CM | POA: Diagnosis not present

## 2018-07-27 DIAGNOSIS — F5101 Primary insomnia: Secondary | ICD-10-CM

## 2018-07-27 DIAGNOSIS — Z96651 Presence of right artificial knee joint: Secondary | ICD-10-CM

## 2018-07-27 DIAGNOSIS — G8929 Other chronic pain: Secondary | ICD-10-CM

## 2018-07-27 NOTE — Patient Instructions (Signed)

## 2018-08-07 DIAGNOSIS — H25813 Combined forms of age-related cataract, bilateral: Secondary | ICD-10-CM | POA: Diagnosis not present

## 2018-09-08 DIAGNOSIS — R1032 Left lower quadrant pain: Secondary | ICD-10-CM | POA: Diagnosis not present

## 2018-09-08 DIAGNOSIS — M545 Low back pain: Secondary | ICD-10-CM | POA: Diagnosis not present

## 2018-09-08 DIAGNOSIS — J069 Acute upper respiratory infection, unspecified: Secondary | ICD-10-CM | POA: Diagnosis not present

## 2018-09-08 DIAGNOSIS — I10 Essential (primary) hypertension: Secondary | ICD-10-CM | POA: Diagnosis not present

## 2018-09-22 DIAGNOSIS — E119 Type 2 diabetes mellitus without complications: Secondary | ICD-10-CM | POA: Diagnosis not present

## 2018-09-22 DIAGNOSIS — E785 Hyperlipidemia, unspecified: Secondary | ICD-10-CM | POA: Diagnosis not present

## 2018-09-22 DIAGNOSIS — I10 Essential (primary) hypertension: Secondary | ICD-10-CM | POA: Diagnosis not present

## 2018-09-22 DIAGNOSIS — R1032 Left lower quadrant pain: Secondary | ICD-10-CM | POA: Diagnosis not present

## 2018-09-22 DIAGNOSIS — E559 Vitamin D deficiency, unspecified: Secondary | ICD-10-CM | POA: Diagnosis not present

## 2018-09-28 DIAGNOSIS — R1032 Left lower quadrant pain: Secondary | ICD-10-CM | POA: Diagnosis not present

## 2018-09-28 DIAGNOSIS — K5730 Diverticulosis of large intestine without perforation or abscess without bleeding: Secondary | ICD-10-CM | POA: Diagnosis not present

## 2018-10-05 DIAGNOSIS — K760 Fatty (change of) liver, not elsewhere classified: Secondary | ICD-10-CM | POA: Diagnosis not present

## 2018-10-05 DIAGNOSIS — N2889 Other specified disorders of kidney and ureter: Secondary | ICD-10-CM | POA: Diagnosis not present

## 2018-10-05 DIAGNOSIS — N2589 Other disorders resulting from impaired renal tubular function: Secondary | ICD-10-CM | POA: Diagnosis not present

## 2019-01-15 DIAGNOSIS — M25572 Pain in left ankle and joints of left foot: Secondary | ICD-10-CM | POA: Diagnosis not present

## 2019-01-15 DIAGNOSIS — E559 Vitamin D deficiency, unspecified: Secondary | ICD-10-CM | POA: Diagnosis not present

## 2019-01-15 DIAGNOSIS — Z981 Arthrodesis status: Secondary | ICD-10-CM | POA: Diagnosis not present

## 2019-01-15 DIAGNOSIS — E785 Hyperlipidemia, unspecified: Secondary | ICD-10-CM | POA: Diagnosis not present

## 2019-01-15 DIAGNOSIS — I10 Essential (primary) hypertension: Secondary | ICD-10-CM | POA: Diagnosis not present

## 2019-01-15 DIAGNOSIS — E119 Type 2 diabetes mellitus without complications: Secondary | ICD-10-CM | POA: Diagnosis not present

## 2019-01-15 DIAGNOSIS — M19072 Primary osteoarthritis, left ankle and foot: Secondary | ICD-10-CM | POA: Diagnosis not present

## 2019-01-15 DIAGNOSIS — G8929 Other chronic pain: Secondary | ICD-10-CM | POA: Diagnosis not present

## 2019-01-17 DIAGNOSIS — I10 Essential (primary) hypertension: Secondary | ICD-10-CM | POA: Diagnosis not present

## 2019-01-18 ENCOUNTER — Other Ambulatory Visit: Payer: Self-pay

## 2019-01-18 NOTE — Patient Outreach (Signed)
[facility]) Care Management  01/18/2019  Ersel Wadleigh 1945/11/16 [id number]   Medication Adherence call to Mrs. Pasty Arch spoke with patient but did not want to engage patient is due on Benazepril 20 mg under [place].   [facility] Management Direct Dial [phone]  Fax [phone] [email or web address]

## 2019-01-29 DIAGNOSIS — Z9181 History of falling: Secondary | ICD-10-CM | POA: Diagnosis not present

## 2019-01-29 DIAGNOSIS — Z136 Encounter for screening for cardiovascular disorders: Secondary | ICD-10-CM | POA: Diagnosis not present

## 2019-01-29 DIAGNOSIS — Z1211 Encounter for screening for malignant neoplasm of colon: Secondary | ICD-10-CM | POA: Diagnosis not present

## 2019-01-29 DIAGNOSIS — R600 Localized edema: Secondary | ICD-10-CM | POA: Diagnosis not present

## 2019-01-29 DIAGNOSIS — Z1339 Encounter for screening examination for other mental health and behavioral disorders: Secondary | ICD-10-CM | POA: Diagnosis not present

## 2019-01-29 DIAGNOSIS — E785 Hyperlipidemia, unspecified: Secondary | ICD-10-CM | POA: Diagnosis not present

## 2019-01-29 DIAGNOSIS — E669 Obesity, unspecified: Secondary | ICD-10-CM | POA: Diagnosis not present

## 2019-01-29 DIAGNOSIS — Z0000 Encounter for general adult medical examination without abnormal findings: Secondary | ICD-10-CM | POA: Diagnosis not present

## 2019-01-29 DIAGNOSIS — Z1331 Encounter for screening for depression: Secondary | ICD-10-CM | POA: Diagnosis not present

## 2019-01-29 DIAGNOSIS — I10 Essential (primary) hypertension: Secondary | ICD-10-CM | POA: Diagnosis not present

## 2019-01-30 DIAGNOSIS — E119 Type 2 diabetes mellitus without complications: Secondary | ICD-10-CM | POA: Diagnosis not present

## 2019-01-30 DIAGNOSIS — E785 Hyperlipidemia, unspecified: Secondary | ICD-10-CM | POA: Diagnosis not present

## 2019-01-30 DIAGNOSIS — E559 Vitamin D deficiency, unspecified: Secondary | ICD-10-CM | POA: Diagnosis not present

## 2019-01-30 DIAGNOSIS — I10 Essential (primary) hypertension: Secondary | ICD-10-CM | POA: Diagnosis not present

## 2019-01-30 DIAGNOSIS — R600 Localized edema: Secondary | ICD-10-CM | POA: Diagnosis not present

## 2019-02-22 DIAGNOSIS — R69 Illness, unspecified: Secondary | ICD-10-CM | POA: Diagnosis not present

## 2019-03-14 DIAGNOSIS — R748 Abnormal levels of other serum enzymes: Secondary | ICD-10-CM | POA: Diagnosis not present

## 2019-03-28 DIAGNOSIS — R69 Illness, unspecified: Secondary | ICD-10-CM | POA: Diagnosis not present

## 2019-04-04 DIAGNOSIS — R69 Illness, unspecified: Secondary | ICD-10-CM | POA: Diagnosis not present

## 2019-04-16 DIAGNOSIS — R69 Illness, unspecified: Secondary | ICD-10-CM | POA: Diagnosis not present

## 2019-05-07 DIAGNOSIS — K621 Rectal polyp: Secondary | ICD-10-CM | POA: Diagnosis not present

## 2019-05-07 DIAGNOSIS — Z1211 Encounter for screening for malignant neoplasm of colon: Secondary | ICD-10-CM | POA: Diagnosis not present

## 2019-05-07 DIAGNOSIS — K5730 Diverticulosis of large intestine without perforation or abscess without bleeding: Secondary | ICD-10-CM | POA: Diagnosis not present

## 2019-05-07 DIAGNOSIS — K635 Polyp of colon: Secondary | ICD-10-CM | POA: Diagnosis not present

## 2019-05-07 DIAGNOSIS — D126 Benign neoplasm of colon, unspecified: Secondary | ICD-10-CM | POA: Diagnosis not present

## 2019-05-14 DIAGNOSIS — R69 Illness, unspecified: Secondary | ICD-10-CM | POA: Diagnosis not present

## 2019-06-05 DIAGNOSIS — Z139 Encounter for screening, unspecified: Secondary | ICD-10-CM | POA: Diagnosis not present

## 2019-06-05 DIAGNOSIS — R69 Illness, unspecified: Secondary | ICD-10-CM | POA: Diagnosis not present

## 2019-06-05 DIAGNOSIS — I10 Essential (primary) hypertension: Secondary | ICD-10-CM | POA: Diagnosis not present

## 2019-06-05 DIAGNOSIS — E119 Type 2 diabetes mellitus without complications: Secondary | ICD-10-CM | POA: Diagnosis not present

## 2019-06-05 DIAGNOSIS — E785 Hyperlipidemia, unspecified: Secondary | ICD-10-CM | POA: Diagnosis not present

## 2019-06-13 DIAGNOSIS — E785 Hyperlipidemia, unspecified: Secondary | ICD-10-CM | POA: Diagnosis not present

## 2019-06-13 DIAGNOSIS — R69 Illness, unspecified: Secondary | ICD-10-CM | POA: Diagnosis not present

## 2019-06-13 DIAGNOSIS — I10 Essential (primary) hypertension: Secondary | ICD-10-CM | POA: Diagnosis not present

## 2019-06-13 DIAGNOSIS — E119 Type 2 diabetes mellitus without complications: Secondary | ICD-10-CM | POA: Diagnosis not present

## 2019-06-19 DIAGNOSIS — B373 Candidiasis of vulva and vagina: Secondary | ICD-10-CM | POA: Diagnosis not present

## 2019-06-19 DIAGNOSIS — R3989 Other symptoms and signs involving the genitourinary system: Secondary | ICD-10-CM | POA: Diagnosis not present

## 2019-06-19 DIAGNOSIS — Z79899 Other long term (current) drug therapy: Secondary | ICD-10-CM | POA: Diagnosis not present

## 2019-06-19 DIAGNOSIS — J45909 Unspecified asthma, uncomplicated: Secondary | ICD-10-CM | POA: Diagnosis not present

## 2019-06-25 DIAGNOSIS — R69 Illness, unspecified: Secondary | ICD-10-CM | POA: Diagnosis not present

## 2019-06-29 DIAGNOSIS — R69 Illness, unspecified: Secondary | ICD-10-CM | POA: Diagnosis not present

## 2019-07-04 DIAGNOSIS — R748 Abnormal levels of other serum enzymes: Secondary | ICD-10-CM | POA: Diagnosis not present

## 2019-11-12 ENCOUNTER — Other Ambulatory Visit: Payer: Self-pay

## 2019-11-12 NOTE — Patient Outreach (Signed)
[facility]) Care Management  11/12/2019  Deliana Frogge Sep 16, 1946 [id number]   Medication Adherence call to Mrs. [place] patient is showing past due on Jardiance 10 mg,patiet has pick up this medictaion from the pharmacy and is taking 1 tablet daily. Mrs. Difatta is showing past due under [place].   [facility] Management Direct Dial [phone]  Fax [phone] [email or web address]

## 2019-12-11 DIAGNOSIS — I10 Essential (primary) hypertension: Secondary | ICD-10-CM | POA: Diagnosis not present

## 2019-12-11 DIAGNOSIS — E785 Hyperlipidemia, unspecified: Secondary | ICD-10-CM | POA: Diagnosis not present

## 2019-12-11 DIAGNOSIS — J45909 Unspecified asthma, uncomplicated: Secondary | ICD-10-CM | POA: Diagnosis not present

## 2019-12-11 DIAGNOSIS — E119 Type 2 diabetes mellitus without complications: Secondary | ICD-10-CM | POA: Diagnosis not present

## 2020-02-15 DIAGNOSIS — G8929 Other chronic pain: Secondary | ICD-10-CM | POA: Diagnosis not present

## 2020-02-15 DIAGNOSIS — M7732 Calcaneal spur, left foot: Secondary | ICD-10-CM | POA: Diagnosis not present

## 2020-02-15 DIAGNOSIS — M19072 Primary osteoarthritis, left ankle and foot: Secondary | ICD-10-CM | POA: Diagnosis not present

## 2020-02-15 DIAGNOSIS — M25572 Pain in left ankle and joints of left foot: Secondary | ICD-10-CM | POA: Diagnosis not present

## 2020-02-15 DIAGNOSIS — M89372 Hypertrophy of bone, left ankle and foot: Secondary | ICD-10-CM | POA: Diagnosis not present

## 2020-02-15 DIAGNOSIS — Z981 Arthrodesis status: Secondary | ICD-10-CM | POA: Diagnosis not present

## 2020-02-15 DIAGNOSIS — M25472 Effusion, left ankle: Secondary | ICD-10-CM | POA: Diagnosis not present

## 2020-02-20 DIAGNOSIS — M47816 Spondylosis without myelopathy or radiculopathy, lumbar region: Secondary | ICD-10-CM | POA: Diagnosis not present

## 2020-02-20 DIAGNOSIS — M549 Dorsalgia, unspecified: Secondary | ICD-10-CM | POA: Diagnosis not present

## 2020-02-20 DIAGNOSIS — M419 Scoliosis, unspecified: Secondary | ICD-10-CM | POA: Diagnosis not present

## 2020-02-20 DIAGNOSIS — M19011 Primary osteoarthritis, right shoulder: Secondary | ICD-10-CM | POA: Diagnosis not present

## 2020-02-20 DIAGNOSIS — M25511 Pain in right shoulder: Secondary | ICD-10-CM | POA: Diagnosis not present

## 2020-02-20 DIAGNOSIS — S4991XA Unspecified injury of right shoulder and upper arm, initial encounter: Secondary | ICD-10-CM | POA: Diagnosis not present

## 2020-02-20 DIAGNOSIS — M546 Pain in thoracic spine: Secondary | ICD-10-CM | POA: Diagnosis not present

## 2020-02-20 DIAGNOSIS — I700 Atherosclerosis of aorta: Secondary | ICD-10-CM | POA: Diagnosis not present

## 2020-03-06 DIAGNOSIS — S22080A Wedge compression fracture of T11-T12 vertebra, initial encounter for closed fracture: Secondary | ICD-10-CM | POA: Diagnosis not present

## 2020-03-14 DIAGNOSIS — S22080A Wedge compression fracture of T11-T12 vertebra, initial encounter for closed fracture: Secondary | ICD-10-CM | POA: Diagnosis not present

## 2020-03-14 DIAGNOSIS — S22000A Wedge compression fracture of unspecified thoracic vertebra, initial encounter for closed fracture: Secondary | ICD-10-CM | POA: Diagnosis not present

## 2020-03-18 DIAGNOSIS — Z79899 Other long term (current) drug therapy: Secondary | ICD-10-CM | POA: Diagnosis not present

## 2020-03-18 DIAGNOSIS — Z9181 History of falling: Secondary | ICD-10-CM | POA: Diagnosis not present

## 2020-03-18 DIAGNOSIS — I10 Essential (primary) hypertension: Secondary | ICD-10-CM | POA: Diagnosis not present

## 2020-03-18 DIAGNOSIS — E785 Hyperlipidemia, unspecified: Secondary | ICD-10-CM | POA: Diagnosis not present

## 2020-03-18 DIAGNOSIS — E119 Type 2 diabetes mellitus without complications: Secondary | ICD-10-CM | POA: Diagnosis not present

## 2020-03-20 DIAGNOSIS — S22080A Wedge compression fracture of T11-T12 vertebra, initial encounter for closed fracture: Secondary | ICD-10-CM | POA: Diagnosis not present

## 2020-03-20 DIAGNOSIS — S32010A Wedge compression fracture of first lumbar vertebra, initial encounter for closed fracture: Secondary | ICD-10-CM | POA: Diagnosis not present

## 2020-03-26 DIAGNOSIS — G959 Disease of spinal cord, unspecified: Secondary | ICD-10-CM | POA: Diagnosis not present

## 2020-03-26 DIAGNOSIS — S22080A Wedge compression fracture of T11-T12 vertebra, initial encounter for closed fracture: Secondary | ICD-10-CM | POA: Diagnosis not present

## 2020-03-27 DIAGNOSIS — C7951 Secondary malignant neoplasm of bone: Secondary | ICD-10-CM | POA: Diagnosis not present

## 2020-04-02 DIAGNOSIS — Z7982 Long term (current) use of aspirin: Secondary | ICD-10-CM | POA: Diagnosis not present

## 2020-04-02 DIAGNOSIS — Z86010 Personal history of colonic polyps: Secondary | ICD-10-CM | POA: Diagnosis not present

## 2020-04-02 DIAGNOSIS — Z79899 Other long term (current) drug therapy: Secondary | ICD-10-CM | POA: Diagnosis not present

## 2020-04-02 DIAGNOSIS — Z791 Long term (current) use of non-steroidal anti-inflammatories (NSAID): Secondary | ICD-10-CM | POA: Diagnosis not present

## 2020-04-02 DIAGNOSIS — E119 Type 2 diabetes mellitus without complications: Secondary | ICD-10-CM | POA: Diagnosis not present

## 2020-04-02 DIAGNOSIS — M1990 Unspecified osteoarthritis, unspecified site: Secondary | ICD-10-CM | POA: Diagnosis not present

## 2020-04-02 DIAGNOSIS — I10 Essential (primary) hypertension: Secondary | ICD-10-CM | POA: Diagnosis not present

## 2020-04-02 DIAGNOSIS — E559 Vitamin D deficiency, unspecified: Secondary | ICD-10-CM | POA: Diagnosis not present

## 2020-04-02 DIAGNOSIS — Z7984 Long term (current) use of oral hypoglycemic drugs: Secondary | ICD-10-CM | POA: Diagnosis not present

## 2020-04-02 DIAGNOSIS — M899 Disorder of bone, unspecified: Secondary | ICD-10-CM | POA: Diagnosis not present

## 2020-04-02 DIAGNOSIS — E7800 Pure hypercholesterolemia, unspecified: Secondary | ICD-10-CM | POA: Diagnosis not present

## 2020-04-08 DIAGNOSIS — Z79899 Other long term (current) drug therapy: Secondary | ICD-10-CM | POA: Diagnosis not present

## 2020-04-08 DIAGNOSIS — Z791 Long term (current) use of non-steroidal anti-inflammatories (NSAID): Secondary | ICD-10-CM | POA: Diagnosis not present

## 2020-04-08 DIAGNOSIS — Z7982 Long term (current) use of aspirin: Secondary | ICD-10-CM | POA: Diagnosis not present

## 2020-04-08 DIAGNOSIS — E7800 Pure hypercholesterolemia, unspecified: Secondary | ICD-10-CM | POA: Diagnosis not present

## 2020-04-08 DIAGNOSIS — Z86010 Personal history of colonic polyps: Secondary | ICD-10-CM | POA: Diagnosis not present

## 2020-04-08 DIAGNOSIS — Z7984 Long term (current) use of oral hypoglycemic drugs: Secondary | ICD-10-CM | POA: Diagnosis not present

## 2020-04-08 DIAGNOSIS — E559 Vitamin D deficiency, unspecified: Secondary | ICD-10-CM | POA: Diagnosis not present

## 2020-04-08 DIAGNOSIS — I10 Essential (primary) hypertension: Secondary | ICD-10-CM | POA: Diagnosis not present

## 2020-04-08 DIAGNOSIS — M1990 Unspecified osteoarthritis, unspecified site: Secondary | ICD-10-CM | POA: Diagnosis not present

## 2020-04-08 DIAGNOSIS — M899 Disorder of bone, unspecified: Secondary | ICD-10-CM | POA: Diagnosis not present

## 2020-04-08 DIAGNOSIS — E119 Type 2 diabetes mellitus without complications: Secondary | ICD-10-CM | POA: Diagnosis not present

## 2020-04-11 DIAGNOSIS — I700 Atherosclerosis of aorta: Secondary | ICD-10-CM | POA: Diagnosis not present

## 2020-04-11 DIAGNOSIS — E1165 Type 2 diabetes mellitus with hyperglycemia: Secondary | ICD-10-CM | POA: Diagnosis not present

## 2020-04-11 DIAGNOSIS — M899 Disorder of bone, unspecified: Secondary | ICD-10-CM | POA: Diagnosis not present

## 2020-04-17 DIAGNOSIS — M899 Disorder of bone, unspecified: Secondary | ICD-10-CM | POA: Diagnosis not present

## 2020-05-20 DIAGNOSIS — N750 Cyst of Bartholin's gland: Secondary | ICD-10-CM | POA: Diagnosis not present

## 2020-05-23 DIAGNOSIS — E119 Type 2 diabetes mellitus without complications: Secondary | ICD-10-CM | POA: Diagnosis not present

## 2020-06-13 DIAGNOSIS — M19072 Primary osteoarthritis, left ankle and foot: Secondary | ICD-10-CM | POA: Diagnosis not present

## 2020-07-17 DIAGNOSIS — E785 Hyperlipidemia, unspecified: Secondary | ICD-10-CM | POA: Diagnosis not present

## 2020-07-17 DIAGNOSIS — Z0000 Encounter for general adult medical examination without abnormal findings: Secondary | ICD-10-CM | POA: Diagnosis not present

## 2020-07-17 DIAGNOSIS — Z139 Encounter for screening, unspecified: Secondary | ICD-10-CM | POA: Diagnosis not present

## 2020-07-17 DIAGNOSIS — Z9181 History of falling: Secondary | ICD-10-CM | POA: Diagnosis not present

## 2020-07-28 DIAGNOSIS — Z1231 Encounter for screening mammogram for malignant neoplasm of breast: Secondary | ICD-10-CM | POA: Diagnosis not present

## 2020-07-28 DIAGNOSIS — I10 Essential (primary) hypertension: Secondary | ICD-10-CM | POA: Diagnosis not present

## 2020-07-28 DIAGNOSIS — E785 Hyperlipidemia, unspecified: Secondary | ICD-10-CM | POA: Diagnosis not present

## 2020-07-28 DIAGNOSIS — E1140 Type 2 diabetes mellitus with diabetic neuropathy, unspecified: Secondary | ICD-10-CM | POA: Diagnosis not present

## 2020-07-31 DIAGNOSIS — R519 Headache, unspecified: Secondary | ICD-10-CM | POA: Diagnosis not present

## 2020-07-31 DIAGNOSIS — R42 Dizziness and giddiness: Secondary | ICD-10-CM | POA: Diagnosis not present

## 2020-07-31 DIAGNOSIS — I6789 Other cerebrovascular disease: Secondary | ICD-10-CM | POA: Diagnosis not present

## 2020-07-31 DIAGNOSIS — Z8669 Personal history of other diseases of the nervous system and sense organs: Secondary | ICD-10-CM | POA: Diagnosis not present

## 2020-07-31 DIAGNOSIS — I739 Peripheral vascular disease, unspecified: Secondary | ICD-10-CM | POA: Diagnosis not present

## 2020-08-11 DIAGNOSIS — G8929 Other chronic pain: Secondary | ICD-10-CM | POA: Diagnosis not present

## 2020-08-11 DIAGNOSIS — M545 Low back pain, unspecified: Secondary | ICD-10-CM | POA: Diagnosis not present

## 2020-08-11 DIAGNOSIS — M4316 Spondylolisthesis, lumbar region: Secondary | ICD-10-CM | POA: Diagnosis not present

## 2020-08-27 DIAGNOSIS — M4316 Spondylolisthesis, lumbar region: Secondary | ICD-10-CM | POA: Diagnosis not present

## 2020-08-27 DIAGNOSIS — M545 Low back pain, unspecified: Secondary | ICD-10-CM | POA: Diagnosis not present

## 2020-09-01 DIAGNOSIS — M4316 Spondylolisthesis, lumbar region: Secondary | ICD-10-CM | POA: Diagnosis not present

## 2020-09-03 DIAGNOSIS — B372 Candidiasis of skin and nail: Secondary | ICD-10-CM | POA: Diagnosis not present

## 2020-09-08 DIAGNOSIS — M47816 Spondylosis without myelopathy or radiculopathy, lumbar region: Secondary | ICD-10-CM | POA: Diagnosis not present

## 2020-10-06 DIAGNOSIS — M47816 Spondylosis without myelopathy or radiculopathy, lumbar region: Secondary | ICD-10-CM | POA: Diagnosis not present

## 2020-10-06 DIAGNOSIS — M4316 Spondylolisthesis, lumbar region: Secondary | ICD-10-CM | POA: Diagnosis not present

## 2020-10-17 DIAGNOSIS — R112 Nausea with vomiting, unspecified: Secondary | ICD-10-CM | POA: Diagnosis not present

## 2020-10-17 DIAGNOSIS — R5383 Other fatigue: Secondary | ICD-10-CM | POA: Diagnosis not present

## 2020-11-03 DIAGNOSIS — M47816 Spondylosis without myelopathy or radiculopathy, lumbar region: Secondary | ICD-10-CM | POA: Diagnosis not present

## 2020-11-27 DIAGNOSIS — M47816 Spondylosis without myelopathy or radiculopathy, lumbar region: Secondary | ICD-10-CM | POA: Diagnosis not present

## 2020-11-27 DIAGNOSIS — M4316 Spondylolisthesis, lumbar region: Secondary | ICD-10-CM | POA: Diagnosis not present

## 2020-12-09 DIAGNOSIS — E559 Vitamin D deficiency, unspecified: Secondary | ICD-10-CM | POA: Diagnosis not present

## 2020-12-09 DIAGNOSIS — R413 Other amnesia: Secondary | ICD-10-CM | POA: Diagnosis not present

## 2020-12-09 DIAGNOSIS — G8929 Other chronic pain: Secondary | ICD-10-CM | POA: Diagnosis not present

## 2020-12-09 DIAGNOSIS — E1140 Type 2 diabetes mellitus with diabetic neuropathy, unspecified: Secondary | ICD-10-CM | POA: Diagnosis not present

## 2020-12-09 DIAGNOSIS — M545 Low back pain, unspecified: Secondary | ICD-10-CM | POA: Diagnosis not present

## 2020-12-18 DIAGNOSIS — M47816 Spondylosis without myelopathy or radiculopathy, lumbar region: Secondary | ICD-10-CM | POA: Diagnosis not present

## 2021-01-09 DIAGNOSIS — G588 Other specified mononeuropathies: Secondary | ICD-10-CM | POA: Diagnosis not present

## 2021-01-15 DIAGNOSIS — B3789 Other sites of candidiasis: Secondary | ICD-10-CM | POA: Diagnosis not present

## 2021-01-28 DIAGNOSIS — L304 Erythema intertrigo: Secondary | ICD-10-CM | POA: Diagnosis not present

## 2021-02-16 ENCOUNTER — Encounter: Payer: Self-pay | Admitting: Gastroenterology

## 2021-02-16 DIAGNOSIS — G8929 Other chronic pain: Secondary | ICD-10-CM | POA: Diagnosis not present

## 2021-02-16 DIAGNOSIS — G588 Other specified mononeuropathies: Secondary | ICD-10-CM | POA: Diagnosis not present

## 2021-03-20 ENCOUNTER — Ambulatory Visit: Payer: Medicare (Managed Care) | Admitting: Pain Medicine

## 2021-03-27 ENCOUNTER — Ambulatory Visit: Payer: Medicare (Managed Care) | Admitting: Pain Medicine

## 2021-03-27 VITALS — BP 150/65 | HR 84 | Temp 97.9°F | Resp 15 | Ht 65.0 in

## 2021-03-27 DIAGNOSIS — M533 Sacrococcygeal disorders, not elsewhere classified: Secondary | ICD-10-CM | POA: Diagnosis not present

## 2021-03-27 NOTE — Progress Notes (Addendum)
Anesthesiology Pain Consult Note  [facility]    This is a confidential report written only for the purpose of professional communication.  Clients who wish to receive these findings are requested to contact the Pain Treatment Center at [facility].       Marie Goodman is a 75 y.o. year old female.  DOB: 1945-12-04  Primary Care Physician: Orlinda Blalock (Inactive)  Outpatient pain medication prescriber: Orlinda Blalock (Inactive)  Chief Complaint: low back pain  Consult requested by: Reece Agar, MD  Reason for Consultation: patient co-management, evaluation for an interventional procedure    History of Present Illness:Marie Goodman is a 75 y.o. year-old female. Patient presents for initial evaluation of low back pain.  Patient's pain started many years ago. She did a lot of heavy lifting in her jobs over the years and she believes it has taken its toll.    She is here from [facility], and has been treated for pain in that area. Her last appointment was in May 2022 at which time she received a cluneal nerve block. She reports no benefit from this injection. They are making the transition to visiting [place] more often to visit her daughter. She will be in [place] until August 2022.     Patient's pain is located in the low back (L>R) and into the left hip. The pain is present constantly. She describes the pain as sharp. Aggravating factors include household chore, riding in a car. Alleviating factors include heat, rest.  Patient  denies weakness or numbness/tingling. Patient denies recent bowel or bladder dysfunction, saddle anesthesia, or loss of balance.    Information in this section was obtained from the direct interview with a patient and review of records.    Pain scores:  VAS 8/10    Functional Level:   - independent mobility without assistive devices.  - independent ADL performance without assistance.     Pharmacological  A) Present Pain Medications &  Effectiveness    -cyclobenzaprine 10 mg daily   -Tramadol 50 mg every 8 hours PRN     Blood thinners:  None    Current Outpatient Medications on File Prior to Visit   Medication Sig Dispense Refill    escitalopram (LEXAPRO) 10 mg tablet Take 10 mg by mouth daily      omeprazole (PRILOSEC) 40 mg capsule Take 40 mg by mouth daily      benazepril-hydrochlorthiazide (LOTENSIN HCT) 20-25 MG per tablet Take 1 tablet by mouth daily      metFORMIN (GLUCOPHAGE-XR) 500 mg 24 hr tablet Take 500 mg by mouth 2 times daily      cyclobenzaprine (FLEXERIL) 10 mg tablet Take 10 mg by mouth daily      traMADol (ULTRAM) 50 mg tablet Take 50 mg by mouth every 8 hours       No current facility-administered medications on file prior to visit.      Past Medical History  No past medical history on file.    Currently Active Problems  There is no problem list on file for this patient.      Past Surgical History  No past surgical history on file.    Family History  No family history on file.    Social History  Social History     Socioeconomic History    Marital status: Married     Spouse name: Not on file    Number of children: Not on file  Years of education: Not on file    Highest education level: Not on file   Occupational History    Not on file   Tobacco Use    Smoking status: Not on file    Smokeless tobacco: Not on file   Substance and Sexual Activity    Alcohol use: Not on file    Drug use: Not on file    Sexual activity: Not on file   Social History Narrative    Not on file     Allergies: No Known Allergies (drug, envir, food or latex)    Current Meds:    Current Outpatient Medications   Medication    escitalopram (LEXAPRO) 10 mg tablet    omeprazole (PRILOSEC) 40 mg capsule    benazepril-hydrochlorthiazide (LOTENSIN HCT) 20-25 MG per tablet    metFORMIN (GLUCOPHAGE-XR) 500 mg 24 hr tablet    cyclobenzaprine (FLEXERIL) 10 mg tablet    traMADol (ULTRAM) 50 mg tablet     No current facility-administered  medications for this visit.       Lab Results:  Chemistry  No results found for: NA, K, CL, CO2, GAP, UN, CRT, GFRC, GFRB, GLU, CA, TP, ALB1, CRP, VITB1, VB12, VDRIA  Endocrine:  No results found for: TSH, T3F, FT4, HA1C  Hematology:  No results found for: WBC, RBC, HGB, HCT, MCV, RDW, PLTP, ASEGR, ALYMR, AMONR, AEOSR, ABASR, ESR    Review Of Systems:  A comprehensive review of 10 systems was performed and reviewed with the patient. Please refer to scanned intake form for details.    Physical Exam:    Vital Signs:   Vitals:    03/27/21 1420   BP: 150/65   Pulse: 84   Resp: 15   Temp: 36.6 C (97.9 F)   Height: 1.651 m (_0 )         General: Awake, Alert, in No apparent distress sitting in examination room  HEENT: Normocephalic, mucus membranes moist  Resp: breathing comfortably  CVS: extremities well perfused  Skin: warm, dry  Psych: Normal affect    Musculoskeletal Examination:   - tenderness to palpation of bilateral SI joint (L>R), left piriformis, left GTB.  - AROM in lumbar spine is limited. PROM in lower extremities is full  - Normal muscle tone, no spasticity, no peripheral edema   Gait: normal  - Able to rise from chair independently  - Posture erect    Special Tests:  - Negative seated straight leg raise bilaterally.  - Positive facet loading test in lumbar spine bilaterally (L>R)  - Positive FABER test bilaterally   - Positive Fortin's sign bilaterally    - Positive Gaenslen's test bilaterally   - Positive Sacral Compression bilaterally   - Positive Pelvic Thrust bilaterally   -Tenderness to palpation over the bilateral sacroiliac joints, Faber's test positive bilaterally, hip thrust positive bilaterally, Gaenslen test positive bilaterally, sacroiliac joint compression test positive bilaterally.    Neurological Examination:  Mental status: alert, awake, and oriented x3.    Sensory:  - LT sensation of lower limbs is intact bilaterally    Motor strength:   Motor strength  Right  Left    L2  Hip flexors  5  5   L3  Knee extensors  5 5   L4  Ankle dorsiflexors  5 5   L5  Extensor hallucis longus  5 5   S1  Ankle plantar flexors  5 5       Opioid Risk Assessment:  Female Female   Family History of Substance Abuse     Alcohol _0  1 _1  3   Illegal Drugs _2  2 _3  3   Rx Drugs _4  4 _5  4     Personal History of Substance Abuse     Alcohol _6  3 _7  3   Illegal Drugs _8  4 _9  4   Rx Drugs _10  5 _11  5        Age Between 14-45 _12  1 _13  1        History of Preadolescent Sexual Abuse _14  3 _15  0     Pyschologic Disease     ADD, OCD, Bipolar, Schizoprenia _16  2 _17  2   Depression _18  1 _19  1     Total = 1    Low risk 0-3   Moderate risk 4-7   High risk >8     iSTOP Registry Review: Reviewed.  Reference number: [id number]    Assessment/Plan: Marie Goodman is a 75 y.o. year-old female. Patient presents for initial evaluation of low back pain.  Patient's pain started many years ago. She did a lot of heavy lifting in her jobs over the years and she believes it has taken its toll. She is here from [facility], and has been treated for pain in that area. Her last appointment was in May 2022 at which time she received a cluneal nerve block. She reports no benefit from this injection. They are making the transition to visiting [place] more often to visit her daughter. She will be in [place] until August 2022.    Patient's symptoms and physical examination are consistent with bilateral sacroiliac joint dysfunction. We have discussed our recommendations with the patient. We recommend:      1). Patient's daughter provided notes and MRI from [facility] pain management office. Notes were placed in bin to scan into eRecord.     2). We are requesting bilateral sacroiliac joint injection with the goal of improving her overall condition to facilitate active rehabilitation.  She has not responded well to physical therapy. Given that this patient has failed multiple modalities in attempt to alleviate their pain, we believe this patient is an  excellent candidate for bilateral sacroiliac joint injection, which will provide this patient with more long-term pain relief, improved quality of life, decreased reliability on medication, increased mobility and function.     Letter of Medical Necessity:   - Pain began >10 years ago  - Her pain is 10/10 at worst.   Patient's pain has been refractory to conservative measures including:  - More than three months of conservative treatment with rest.  - More than three months of reduced activity.   - Trial of physical therapy/chiropractic care - Dates: 2019. 12/21 - 6/22 with [facility].  - Health care provider prescribed, self directed, independent exercise program performed daily.   - Patient has failed to respond to NSAID > 3 weeks  and > 6 weeks activity modification.  - Trial of medications including: tramadol, acetaminophen, cyclobenzaprine.  -Impact on activities of daily living: The pain is significantly impacting quality life and ability to participate in activities of daily living including; cooking, cleaning, household chores, pursuit of hobbies and gainful employment.  -Tenderness to palpation over the bilateral sacroiliac joints, Faber's test positive bilaterally, hip thrust positive bilaterally, Gaenslen test positive bilaterally, sacroiliac joint compression test positive bilaterally.    Clinical findings and imaging studies suggest no other obvious cause of the pain. History, physical examination, and clinical correlation  was performed for the diagnosis of this pain syndrome by an experienced pain medicine provider.     We are therefore requesting authorization to proceed with bilateral SI joint injection with the goal of improving the patient's function, range of motion, mobility, pain, and decrease reliance on medications.     Follow up for procedure     Risks and benefits were explained. Patient has verbalized understanding and agreed to proceed with a proposed treatment plan.    Patient was encouraged to  contact us for any worsening symptoms, persisting symptoms, or any other concerns.  Patient was provided the opportunity to ask questions.  Thank you for allowing [place] the opportunity to care for this patient.    Patient was discussed, examined, and treatment plan developed with the attending, Dr. Signe Colt.    Authored by: Martina Sinner, NP  03/27/2021     I have reviewed the resident / fellow / PA / NP note above and was present for key components of the visit. I have edited the note as appropriate.  The substantive portion of the visit, including the medical decision making, was performed by me, that encompassed the risk of complications and/or morbidity or mortality from the presenting problem, diagnostic tests ordered and treatment options and alternatives.  The options for procedural intervention were discussed by me in detail outlining the specific procedural maneuvers utilizing various teaching aids (spine model, wall poster patient brochure were indicated), risks and benefits inherent in such intervention as well as the risks, side effects and potential benefits of medication management.    Audie Clear. Khloe Hunkele, M.D.  Chief, Division of Pain Medicine  Director, [facility]    Associate Professor  [facility]  [address], [place], [place] [address]  Office: [phone]    Direct: [phone]   Fax: [phone]

## 2021-04-16 ENCOUNTER — Ambulatory Visit: Payer: Medicare (Managed Care) | Attending: Pain Medicine | Admitting: Pain Medicine

## 2021-04-16 ENCOUNTER — Encounter: Payer: Self-pay | Admitting: Pain Medicine

## 2021-04-16 ENCOUNTER — Ambulatory Visit
Admission: RE | Admit: 2021-04-16 | Discharge: 2021-04-16 | Disposition: A | Discharge status: Home / Self Care | Payer: Medicare (Managed Care) | Source: Ambulatory Visit | Attending: Pain Medicine | Admitting: Pain Medicine

## 2021-04-16 ENCOUNTER — Other Ambulatory Visit: Payer: Self-pay | Admitting: Pain Medicine

## 2021-04-16 VITALS — BP 144/77 | HR 94 | Temp 95.9°F | Resp 16

## 2021-04-16 DIAGNOSIS — G8929 Other chronic pain: Secondary | ICD-10-CM | POA: Diagnosis not present

## 2021-04-16 DIAGNOSIS — R52 Pain, unspecified: Secondary | ICD-10-CM | POA: Diagnosis not present

## 2021-04-16 DIAGNOSIS — M533 Sacrococcygeal disorders, not elsewhere classified: Secondary | ICD-10-CM | POA: Diagnosis not present

## 2021-04-16 LAB — POCT GLUCOSE: Glucose POCT: 239 mg/dL — ABNORMAL HIGH (ref 60–99)

## 2021-04-16 NOTE — Progress Notes (Signed)
Patient was examined prior to this procedure.      Change in health status: No      Interval change in structure, distribution quality of pain: No      Interval Change:   Medication Allergies:  No   Contrast Dye Allergies: No   Latex Allergies:  No   Current Antibiotics:  No   Current Blood Thinners: No   Recent Fevers/Chills:  No   History of Diabetes:  No   Current Steroids:  No   NPO:    Yes      Plan: proceed with  bilateral sacroiliac joint injection 

## 2021-04-16 NOTE — Patient Instructions (Signed)
[facility]  Sacro-iliac joint injection  Discharge Instructions      Your doctor has given you a sacro-iliac (SI) joint injection (s).  The local anesthetic medication will provide a temporary numbing effect.  A steroid medication may be used to decrease inflammation, and will work slowly over the next one to two weeks.    After the injection you may notice the following:    1. Increase tenderness in the area of your usual pain.  2. Leg weakness    These effects are temporary and will diminish over time.    For injection site tenderness, apply an ice pack to the injection site for a 20 minute period, then remove for one hour.  Repeat as necessary.    Please notify the Pain Treatment Center if you notice:    1. Redness, swelling or drainage from the injection sites or have a fever  2. Prolonged leg weakness    Activity:  You may carry out your regular daily activities, avoid doing things that you have not done for some time (example: heavy housework or lifting etc).  It is very important to continue your regular exercise program    If you have received any medication to help you relax or for pain,  you should not drive, operate heavy machinery or make major decisions for the next 12 hours.    Please contact a nurse at the Pain Treatment Center at Dept: [phone] should you have any questions or concerns regarding your treatment.      I have received and understand the above instructions.

## 2021-04-17 NOTE — Procedures (Signed)
Date/Time: 04/16/2021  2:50 PM EDT  Tendons, Ligaments, Muscles, and Joint/Bursa Injection(s): SI Injection - CPT 27096  Laterality: Bilateral    Sacroiliac Joint Injection Procedure Note    04/17/2021  Maziyah Vessel  [id number]  @    ALL MEDICATIONS PER MAR ABOVE    PROCEDURALIST:  Eithel Ryall    PROCEDURE DIAGNOSIS: SIJ Pain    PROCEDURE:  Fluoroscopically guided, contrast controlled, bilateral sacroiliac joint injection.       INDICATIONS FOR PROCEDURE:  After a discussion of the risks and benefits including but not limited to bleeding, infection, nerve damage, informed consent was obtained to proceed with the procedure.     The medications are as outlined on EMR, and those were reviewed and reconciled.    PROCEDURE:    The anticipated injection site was marked and initialed by Dr. Barnet Glasgow.      The patient was brought to the flouroscopy suite and positioned prone on the fluoroscopy table, prepped, and draped in the usual sterile fashion. The injection site was sterilely prepped and draped.  Final verification ("time out") was performed by Dr Barnet Glasgow and the assistants to ensure the correct patient identity, site and agreement on the procedure to be done.    The sacroiliac joint was identified, and the area over the entry site was anesthetized with a 2 ml's of 1% Lidocaine. Then under direct fluoroscopic guidance, a 3.5 inch 22-gauge spinal needle was advanced into the SI joint. The needle position was found to be adequate on bi planer fluoroscopic views, and aspirations of blood were negative.     We then proceeded with injection of 40mg  depomedrol, as well as 0.5% bupivacaine. The needle wass re-styletted and withdrawn. The patient tolerated the procedure well. There were no complications.     The patient tolerated the procedure well.  There was no new subjective or objective numbness, weakness, or paresthesias following the injection.      The patient was advised to contact the Pain Clinic if any  questions or complications arise.     I was present and supervised the entire procedure.     , MD

## 2021-05-15 ENCOUNTER — Ambulatory Visit: Payer: Medicare (Managed Care) | Admitting: Pain Medicine

## 2021-05-22 ENCOUNTER — Ambulatory Visit: Payer: Medicare (Managed Care) | Admitting: Pain Medicine

## 2021-05-22 ENCOUNTER — Encounter: Payer: Self-pay | Admitting: Pain Medicine

## 2021-05-22 VITALS — BP 138/63 | HR 83 | Temp 97.0°F

## 2021-05-22 DIAGNOSIS — M5136 Other intervertebral disc degeneration, lumbar region: Secondary | ICD-10-CM | POA: Diagnosis not present

## 2021-05-26 ENCOUNTER — Encounter: Payer: Self-pay | Admitting: Pain Medicine

## 2021-05-26 NOTE — Progress Notes (Signed)
Anesthesiology Pain Follow Up  [facility]     This is a confidential report written only for the purpose of professional communication.  Clients who wish to receive these findings are requested to contact the [facility] Center at [place].         Marie Goodman is a 75 y.o. year old female.  DOB: 1946/05/12  Primary Care Physician: Orlinda Blalock (Inactive)  Outpatient pain medication prescriber: Orlinda Blalock (Inactive)  Chief Complaint: low back pain  Consult requested by: Reece Agar, MD  Reason for Consultation: patient co-management, evaluation for an interventional procedure    Primary pain complaint:  Axial lumbosacral back pain    Interval history:  Marie Goodman presents today with ongoing axial lumbosacral back pain.  The pain is centered in her axial low back does not radiate into the legs.  There is no numbness tingling or weakness of the limbs.  There is no bowel or bladder incontinence there is no focal deficits.  The pain is centered in her axial low back.  The pain is severe impacting her quality life and function and ability to take care of her husband who has Alzheimer's disease.  The pain is centered in the low back does not radiate into the leg.  There is no radicular component.  There is no bowel or bladder incontinence there is no numbness tingling or weakness.     History of Present Illness:Marie Goodman is a 75 y.o. year-old female. Patient presents for initial evaluation of low back pain.  Patient's pain started many years ago. She did a lot of heavy lifting in her jobs over the years and she believes it has taken its toll.     She is here from [facility], and has been treated for pain in that area. Her last appointment was in May 2022 at which time she received a cluneal nerve block. She reports no benefit from this injection. They are making the transition to visiting [place] more often to visit her daughter. She will be in [place] until  August 2022.      Patient's pain is located in the low back (L>R) and into the left hip. The pain is present constantly. She describes the pain as sharp. Aggravating factors include household chore, riding in a car. Alleviating factors include heat, rest.  Patient  denies weakness or numbness/tingling. Patient denies recent bowel or bladder dysfunction, saddle anesthesia, or loss of balance.     Information in this section was obtained from the direct interview with a patient and review of records.     Pain scores:  VAS 8/10     Functional Level:   - independent mobility without assistive devices.  - independent ADL performance without assistance.      Pharmacological  A) Present Pain Medications & Effectiveness     -cyclobenzaprine 10 mg daily   -Tramadol 50 mg every 8 hours PRN      Blood thinners:  None            Current Outpatient Medications on File Prior to Visit   Medication Sig Dispense Refill    escitalopram (LEXAPRO) 10 mg tablet Take 10 mg by mouth daily        omeprazole (PRILOSEC) 40 mg capsule Take 40 mg by mouth daily        benazepril-hydrochlorthiazide (LOTENSIN HCT) 20-25 MG per tablet Take 1 tablet by mouth daily        metFORMIN (  GLUCOPHAGE-XR) 500 mg 24 hr tablet Take 500 mg by mouth 2 times daily        cyclobenzaprine (FLEXERIL) 10 mg tablet Take 10 mg by mouth daily        traMADol (ULTRAM) 50 mg tablet Take 50 mg by mouth every 8 hours          No current facility-administered medications on file prior to visit.      Past Medical History  No past medical history on file.     Currently Active Problems  There is no problem list on file for this patient.        Past Surgical History  No past surgical history on file.     Family History  No family history on file.     Social History  Social History   Social History            Socioeconomic History    Marital status: Married       Spouse name: Not on file    Number of children: Not on file    Years of education: Not on file    Highest  education level: Not on file   Occupational History    Not on file   Tobacco Use    Smoking status: Not on file    Smokeless tobacco: Not on file   Substance and Sexual Activity    Alcohol use: Not on file    Drug use: Not on file    Sexual activity: Not on file   Social History Narrative    Not on file         Allergies: No Known Allergies (drug, envir, food or latex)     Current Meds:        Current Outpatient Medications   Medication    escitalopram (LEXAPRO) 10 mg tablet    omeprazole (PRILOSEC) 40 mg capsule    benazepril-hydrochlorthiazide (LOTENSIN HCT) 20-25 MG per tablet    metFORMIN (GLUCOPHAGE-XR) 500 mg 24 hr tablet    cyclobenzaprine (FLEXERIL) 10 mg tablet    traMADol (ULTRAM) 50 mg tablet      No current facility-administered medications for this visit.         Lab Results:  Chemistry  No results found for: NA, K, CL, CO2, GAP, UN, CRT, GFRC, GFRB, GLU, CA, TP, ALB1, CRP, VITB1, VB12, VDRIA  Endocrine:  No results found for: TSH, T3F, FT4, HA1C  Hematology:  No results found for: WBC, RBC, HGB, HCT, MCV, RDW, PLTP, ASEGR, ALYMR, AMONR, AEOSR, ABASR, ESR     Review Of Systems:  A comprehensive review of 10 systems was performed and reviewed with the patient. Please refer to scanned intake form for details.     Physical Exam:   BP 138/63 (BP Location: Left arm, Patient Position: Sitting, Cuff Size: large adult)    Pulse 83    Temp 36.1 C (97 F) (Temporal)    SpO2 96%        General: Awake, Alert, in No apparent distress sitting in examination room  HEENT: Normocephalic, mucus membranes moist  Resp: breathing comfortably  CVS: extremities well perfused  Skin: warm, dry  Psych: Normal affect     Musculoskeletal Examination:   - tenderness to palpation of bilateral SI joint (L>R), left piriformis, left GTB.  - AROM in lumbar spine is limited. PROM in lower extremities is full  - Normal muscle tone, no spasticity, no peripheral edema   Gait: normal  -  Able to rise from chair independently  -  Posture erect     Special Tests:  - Negative seated straight leg raise bilaterally.  - Positive facet loading test in lumbar spine bilaterally (L>R)  - Positive FABER test bilaterally   - Positive Fortin's sign bilaterally    - Positive Gaenslen's test bilaterally   - Positive Sacral Compression bilaterally   - Positive Pelvic Thrust bilaterally   -Tenderness to palpation over the bilateral sacroiliac joints, Faber's test positive bilaterally, hip thrust positive bilaterally, Gaenslen test positive bilaterally, sacroiliac joint compression test positive bilaterally.     Neurological Examination:  Mental status: alert, awake, and oriented x3.     Sensory:  - LT sensation of lower limbs is intact bilaterally     Motor strength:    Motor strength  Right  Left    L2  Hip flexors  5 5   L3  Knee extensors  5 5   L4  Ankle dorsiflexors  5 5   L5  Extensor hallucis longus  5 5   S1  Ankle plantar flexors  5 5         Opioid Risk Assessment:    Female Female   Family History of Substance Abuse       Alcohol _0 ? 1 _1 ? 3   Illegal Drugs _2 ? 2 _3 ? 3   Rx Drugs _4 ? 4 _5 ? 4      Personal History of Substance Abuse       Alcohol _6 ? 3 _7 ? 3   Illegal Drugs _8 ? 4 _9 ? 4   Rx Drugs _10 ? 5 _11 ? 5           Age Between 16-45 _12 ? 1 _13 ? 1           History of Preadolescent Sexual Abuse _14 ? 3 _15 ? 0      Pyschologic Disease       ADD, OCD, Bipolar, Schizoprenia _16 ? 2 _17 ? 2   Depression _18 ? 1 _19 ? 1      Total = 1     Low risk 0-3   Moderate risk 4-7   High risk >8      iSTOP Registry Review: Reviewed.  Reference number: [id number]     Assessment/Plan: Marie Goodman is a 75 y.o. year-old female. Patient presents for initial evaluation of low back pain.  Patient's pain started many years ago. She did a lot of heavy lifting in her jobs over the years and she believes it has taken its toll. She is here from [facility], and has been treated for pain in that area.  Her pain complaints are corroborated by physical exam and imaging and  are consistent with lumbar degenerative disc disease.  I personally reviewed the results of her lumbar MRI her discs L4 and L5 are approximate grade 5 out of 8 on a modified grading scale and are amenable to intradiscal allograft.     1). Patient's daughter provided notes and MRI from [facility] pain management office. Notes were placed in bin to scan into eRecord.      2). We are requesting VIA Disc L4, VIA Disc L5 ( one week apart).     Letter of Medical Necessity:   - Pain began >10 years ago  - Her pain is 10/10 at worst. Lumbar DDD.  Patient's pain has been refractory to conservative measures including:  - More than three months of conservative treatment with rest.  - More than three months of reduced activity.   -  Trial of physical therapy/chiropractic care - Dates: 2019. 12/21 - 6/22 with [facility].  - Health care provider prescribed, self directed, independent exercise program performed daily.   - Patient has failed to respond to NSAID > 3 weeks  and > 6 weeks activity modification.  - Trial of medications including: tramadol, acetaminophen, cyclobenzaprine.  -Impact on activities of daily living: The pain is significantly impacting quality life and ability to participate in activities of daily living including; cooking, cleaning, household chores, pursuit of hobbies and gainful employment.  -Tenderness to palpation over the bilateral sacroiliac joints, Faber's test positive bilaterally, hip thrust positive bilaterally, Gaenslen test positive bilaterally, sacroiliac joint compression test positive bilaterally.   -The patient has failed sacroiliac joint injections, cluneal nerve blocks, facet injections and lumbar surgery.  -She does not want any further lumbar surgery.  Clinical findings and imaging studies suggest no other obvious cause of the pain. History, physical examination, and clinical correlation was performed for the diagnosis of this pain syndrome by an experienced pain medicine provider.         VIA  Disc Intradiscal Therapy:          VIA Disc NP is intended for use as an allograft to supplement degenerative intervertebral discs.  An allograft is tissue recovered from an human cadaveric donor that is transferred to the human recipient. VIA Disc NP consists of dehydrated nucleus pulposus particulate derived from the intervertebral disc region of the donor.  The nucleus pulposus particulate is mixed together with saline and delivered into the intervertebral disc during a nonsurgical spinal procedure. The VIA Disc NP procedure is performed under local anesthesia or moderate sedation.  It is a completely outpatient procedure that does not require operating room time or an overnight hospital stay.  Clinical studies demonstrate 5-year efficacy and improvement in axial lumbosacral back pain and regeneration of degenerated intervertebral disc spaces. The patient was provided a patient-centered brochure today detailing the nature of the cadaveric allograft and the procedural overview.    Beall DP, Wilson GL, Bishop R, Tally W. VAST Clinical Trial: Safely Supplementing Tissue Lost to Degenerative Disc Disease. Int J Spine Surg. 2020 Apr 30;14(2):239-253. doi: 10.14444/7033. PMID: 33354562; PMCID: BWL8937342.   Follow up for procedure      Risks and benefits were explained. Patient has verbalized understanding and agreed to proceed with a proposed treatment plan.     Patient was encouraged to contact us for any worsening symptoms, persisting symptoms, or any other concerns.  Patient was provided the opportunity to ask questions.  Thank you for allowing [place] the opportunity to care for this patient.    Audie Clear. Kc Sedlak, M.D.  Chief, Division of Pain Medicine  Director, [place] [facility]     Associate Professor  [place] [facility]  [address], [place], [place] [address]  Office:  [phone]    Direct: [phone]   Fax: [phone]

## 2021-06-10 ENCOUNTER — Telehealth: Payer: Self-pay

## 2021-06-10 NOTE — Telephone Encounter (Signed)
I called patient yesterday and left message for her to call back to make appointment.    Please transfer to Marie Goodman or Marie Goodman    We need to set up VIA Disc L4 [facility]) Berkley Harvey [phone] exp 11.23.22 with [facility].

## 2021-06-16 ENCOUNTER — Telehealth: Payer: Self-pay

## 2021-06-16 NOTE — Telephone Encounter (Signed)
Called pt to complete pre procedure screen. No answer, voicemail message left requesting call back to complete

## 2021-06-16 NOTE — Telephone Encounter (Signed)
Called pt and pre procedure instructions left.  Pt advised of appointment on September 15 at 10:20 Pt will need someone to drive them and accompany them home.  Pt driver will need to wait outside. Pt will not need to be NPO for 6 hours prior to procedure.  Medications may be taken with a sip of water. Infection prevention screening done by phone. Reminded pt to call with any symptoms, exposure, or testing Covid 19 related or infection requiring antibiotics. Pt reminded they must wear a mask to enter the building. Pt verbalizes understanding.

## 2021-06-18 ENCOUNTER — Ambulatory Visit: Payer: Medicare (Managed Care) | Attending: Pain Medicine | Admitting: Pain Medicine

## 2021-06-18 ENCOUNTER — Other Ambulatory Visit: Payer: Self-pay | Admitting: Pain Medicine

## 2021-06-18 ENCOUNTER — Ambulatory Visit
Admission: RE | Admit: 2021-06-18 | Discharge: 2021-06-18 | Disposition: A | Discharge status: Home / Self Care | Payer: Medicare (Managed Care) | Source: Ambulatory Visit | Attending: Pain Medicine | Admitting: Pain Medicine

## 2021-06-18 ENCOUNTER — Encounter: Payer: Self-pay | Admitting: Pain Medicine

## 2021-06-18 VITALS — BP 133/72 | HR 83 | Temp 98.0°F | Resp 18

## 2021-06-18 DIAGNOSIS — R52 Pain, unspecified: Secondary | ICD-10-CM | POA: Diagnosis not present

## 2021-06-18 DIAGNOSIS — M5136 Other intervertebral disc degeneration, lumbar region: Secondary | ICD-10-CM | POA: Diagnosis not present

## 2021-06-18 DIAGNOSIS — M5451 Vertebrogenic low back pain: Secondary | ICD-10-CM | POA: Diagnosis not present

## 2021-06-18 MED ORDER — FENTANYL CITRATE 50 MCG/ML IJ SOLN *WRAPPED*
100.0000 ug | Freq: Once | INTRAMUSCULAR | Status: AC
Start: 2021-06-18 — End: 2021-06-18
  Administered 2021-06-18: 100 ug via INTRAVENOUS

## 2021-06-18 MED ORDER — FENTANYL CITRATE 50 MCG/ML IJ SOLN *WRAPPED*
INTRAMUSCULAR | Status: AC
Start: 2021-06-18 — End: 2021-06-18
  Filled 2021-06-18: qty 2

## 2021-06-18 MED ORDER — CEFAZOLIN SODIUM 1000 MG IJ SOLR *I*
INTRAMUSCULAR | Status: AC
Start: 2021-06-18 — End: 2021-06-18
  Filled 2021-06-18: qty 20

## 2021-06-18 MED ORDER — CEFAZOLIN SODIUM 1000 MG IJ SOLR *I*
2000.0000 mg | Freq: Once | INTRAMUSCULAR | Status: AC
Start: 2021-06-18 — End: 2021-06-18
  Administered 2021-06-18: 2000 mg via INTRAVENOUS

## 2021-06-18 MED ORDER — SODIUM CHLORIDE 0.9 % IV SOLN WRAPPED *I*
30.0000 mL/h | Status: AC
Start: 2021-06-18 — End: 2021-06-18
  Administered 2021-06-18: 30 mL/h via INTRAVENOUS

## 2021-06-18 NOTE — Patient Instructions (Signed)
[facility]  Discharge Instructions  Via-Disc    Via-Disc is intended for use as an allograft to supplement degenerated intervertebral discs.  An allograft is tissue recovered from a human cadaveric donor that is transferred to a human recipient.  Via Disc consists of dehydrated nucleus pulposus particulate derived from the intervertebral disc region of the donor.  The nucleus pulposus particulate is mixed together with saline and delivered into your intervertebral disc during a non-surgical spinal procedure.          The following may occur after the procedure:  1.  Pain that may last several hours after the procedure will subside or return to baseline  2. Pain or soreness at the puncture sites    ICE: For injection site tenderness, apply an ice pack to the injection site for 20 minute period, then remove for one hour.  Repeat as necessary.    Showering: Do not shower or submerge the injection site in water (pool, hot tub, bath tub, etc) for 48 hours.  You may remove the band-aid after 48 hours.      Activity:  Avoid any strenuous activity for 3-4 days, this includes bending, twisting, squatting, or flexing.  Begin to increase your daily activities slowly as indicated by your physician.        NO DRIVING FOR 24 HOURS FOLLOWING YOUR VIA-DISC PROCEDURE.   If you have received any medication to help you relax and for pain, you should not drive, operate heavy machinery or make major decisions for 24 hours.        Resume previous medications as ordered by your physician.  Drink 6-8 glasses of fluids each day to help flush the dye out of your body.      Notify the Pain Treatment Center for the following:  1.  Fever, chills, redness, swelling, drainage at the injection sites  2. Unusual/severe pain that is different from immediately before the procedure.      If you experience any other symptoms or have concerns you think are related to this procedure, contact the Pain Treatment  Center at [phone].

## 2021-06-18 NOTE — Progress Notes (Signed)
Pt received discharge instructions from nurse status post procedure and received paperwork. Pt verbalizes understanding.

## 2021-06-18 NOTE — Progress Notes (Signed)
Patient was examined prior to this procedure.      Change in health status: No      Interval change in structure, distribution quality of pain: No      Interval Change:   Medication Allergies:  no   Contrast Dye Allergies: No   Latex Allergies:  No   Current Antibiotics:  No   Current Blood Thinners: No   Recent Fevers/Chills:  No   History of Diabetes:  No   Current Steroids:  No   NPO:    Yes    Plan: proceed with VIA Disc L4

## 2021-06-19 NOTE — Progress Notes (Signed)
Anesthesiology Pain Follow Up  [facility]     This is a confidential report written only for the purpose of professional communication.  Clients who wish to receive these findings are requested to contact the [facility] at [place].         Marie Goodman is a 75 y.o. year old female.  DOB: 1946-02-27  Primary Care Physician: Orlinda Blalock (Inactive)  Outpatient pain medication prescriber: Orlinda Blalock (Inactive)  Chief Complaint: low back pain  Consult requested by: Reece Agar, MD  Reason for Consultation: patient co-management, evaluation for an interventional procedure     Primary pain complaint:  Axial lumbosacral back pain    Interval History: Post-Block:    Diagnostic Block Assessment Sheet      Marie Goodman  [id number]     Laterality / Levels: VIA Disc L4    Block:  1 of 2    Pre-procedure pain score:  10 at worst    Post-procedure pain score:  2      Virl Diamond, MD    The VIA Disc L4 on 06/18/21 provided the patient with 80% improvement of pain with improved range of motion and functional activities of daily living including cooking cleaning and household chores.       Interval history:  Marie Goodman presents today with ongoing axial lumbosacral back pain.  The pain is centered in her axial low back does not radiate into the legs.  There is no numbness tingling or weakness of the limbs.  There is no bowel or bladder incontinence there is no focal deficits.  The pain is centered in her axial low back.  The pain is severe impacting her quality life and function and ability to take care of her husband who has Alzheimer's disease.  The pain is centered in the low back does not radiate into the leg.  There is no radicular component.  There is no bowel or bladder incontinence there is no numbness tingling or weakness.     History of Present Illness:Marie Goodman is a 75 y.o. year-old female. Patient presents for initial evaluation of low back pain.  Patient's pain started  many years ago. She did a lot of heavy lifting in her jobs over the years and she believes it has taken its toll.     She is here from [facility], and has been treated for pain in that area. Her last appointment was in May 2022 at which time she received a cluneal nerve block. She reports no benefit from this injection. They are making the transition to visiting [place] more often to visit her daughter. She will be in [place] until August 2022.      Patient's pain is located in the low back (L>R) and into the left hip. The pain is present constantly. She describes the pain as sharp. Aggravating factors include household chore, riding in a car. Alleviating factors include heat, rest.  Patient  denies weakness or numbness/tingling. Patient denies recent bowel or bladder dysfunction, saddle anesthesia, or loss of balance.     Information in this section was obtained from the direct interview with a patient and review of records.     Pain scores:  VAS 8/10     Functional Level:   - independent mobility without assistive devices.  - independent ADL performance without assistance.      Pharmacological  A) Present Pain Medications & Effectiveness     -cyclobenzaprine 10 mg  daily   -Tramadol 50 mg every 8 hours PRN      Blood thinners:  None                 Current Outpatient Medications on File Prior to Visit   Medication Sig Dispense Refill    escitalopram (LEXAPRO) 10 mg tablet Take 10 mg by mouth daily        omeprazole (PRILOSEC) 40 mg capsule Take 40 mg by mouth daily        benazepril-hydrochlorthiazide (LOTENSIN HCT) 20-25 MG per tablet Take 1 tablet by mouth daily        metFORMIN (GLUCOPHAGE-XR) 500 mg 24 hr tablet Take 500 mg by mouth 2 times daily        cyclobenzaprine (FLEXERIL) 10 mg tablet Take 10 mg by mouth daily        traMADol (ULTRAM) 50 mg tablet Take 50 mg by mouth every 8 hours          No current facility-administered medications on file prior to visit.      Past Medical  History  No past medical history on file.     Currently Active Problems  There is no problem list on file for this patient.        Past Surgical History  No past surgical history on file.     Family History  No family history on file.     Social History  Social History   Social History                Socioeconomic History    Marital status: Married       Spouse name: Not on file    Number of children: Not on file    Years of education: Not on file    Highest education level: Not on file   Occupational History    Not on file   Tobacco Use    Smoking status: Not on file    Smokeless tobacco: Not on file   Substance and Sexual Activity    Alcohol use: Not on file    Drug use: Not on file    Sexual activity: Not on file   Social History Narrative    Not on file         Allergies: No Known Allergies (drug, envir, food or latex)     Current Meds:          Current Outpatient Medications   Medication    escitalopram (LEXAPRO) 10 mg tablet    omeprazole (PRILOSEC) 40 mg capsule    benazepril-hydrochlorthiazide (LOTENSIN HCT) 20-25 MG per tablet    metFORMIN (GLUCOPHAGE-XR) 500 mg 24 hr tablet    cyclobenzaprine (FLEXERIL) 10 mg tablet    traMADol (ULTRAM) 50 mg tablet      No current facility-administered medications for this visit.         Lab Results:  Chemistry  No results found for: NA, K, CL, CO2, GAP, UN, CRT, GFRC, GFRB, GLU, CA, TP, ALB1, CRP, VITB1, VB12, VDRIA  Endocrine:  No results found for: TSH, T3F, FT4, HA1C  Hematology:  No results found for: WBC, RBC, HGB, HCT, MCV, RDW, PLTP, ASEGR, ALYMR, AMONR, AEOSR, ABASR, ESR     Review Of Systems:  A comprehensive review of 10 systems was performed and reviewed with the patient. Please refer to scanned intake form for details.     Physical Exam:  BP 133/72    Pulse 83  Temp 36.7 C (98 F)    Resp 18    SpO2 93%        General: Awake, Alert, in No apparent distress sitting in examination room  HEENT: Normocephalic, mucus membranes moist  Resp:  breathing comfortably  CVS: extremities well perfused  Skin: warm, dry  Psych: Normal affect     Musculoskeletal Examination:   - tenderness to palpation of bilateral SI joint (L>R), left piriformis, left GTB.  - AROM in lumbar spine is limited. PROM in lower extremities is full  - Normal muscle tone, no spasticity, no peripheral edema   Gait: normal  - Able to rise from chair independently  - Posture erect     Special Tests:  - Negative seated straight leg raise bilaterally.  - Positive facet loading test in lumbar spine bilaterally (L>R)  - Positive FABER test bilaterally   - Positive Fortin's sign bilaterally    - Positive Gaenslen's test bilaterally   - Positive Sacral Compression bilaterally   - Positive Pelvic Thrust bilaterally   -Tenderness to palpation over the bilateral sacroiliac joints, Faber's test positive bilaterally, hip thrust positive bilaterally, Gaenslen test positive bilaterally, sacroiliac joint compression test positive bilaterally.     Neurological Examination:  Mental status: alert, awake, and oriented x3.     Sensory:  - LT sensation of lower limbs is intact bilaterally     Motor strength:    Motor strength  Right  Left    L2  Hip flexors  5 5   L3  Knee extensors  5 5   L4  Ankle dorsiflexors  5 5   L5  Extensor hallucis longus  5 5   S1  Ankle plantar flexors  5 5         Opioid Risk Assessment:    Female Female   Family History of Substance Abuse       Alcohol _0 ?? 1 _1 ?? 3   Illegal Drugs _2 ?? 2 _3 ?? 3   Rx Drugs _4 ?? 4 _5 ?? 4      Personal History of Substance Abuse       Alcohol _6 ?? 3 _7 ?? 3   Illegal Drugs _8 ?? 4 _9 ?? 4   Rx Drugs _10 ?? 5 _11 ?? 5           Age Between 16-45 _12 ?? 1 _13 ?? 1           History of Preadolescent Sexual Abuse _14 ?? 3 _15 ?? 0      Pyschologic Disease       ADD, OCD, Bipolar, Schizoprenia _16 ?? 2 _17 ?? 2   Depression _18 ?? 1 _19 ?? 1      Total = 1     Low risk 0-3   Moderate risk 4-7   High risk >8      iSTOP Registry Review: Reviewed.  Reference number: [id number]      Assessment/Plan: Marie Goodman is a 75 y.o. year-old female. Patient presents for initial evaluation of low back pain.  Patient's pain started many years ago. She did a lot of heavy lifting in her jobs over the years and she believes it has taken its toll. She is here from [facility], and has been treated for pain in that area.  Her pain complaints are corroborated by physical exam and imaging and are consistent with lumbar degenerative disc disease.  I personally reviewed the results of her lumbar MRI her discs L4 and L5 are approximate grade 5 out of 8 on a modified grading scale  and are amenable to intradiscal allograft.     1). Patient's daughter provided notes and MRI from [facility] pain management office. Notes were placed in bin to scan into eRecord.      2). We are requesting VIA Disc L5     Letter of Medical Necessity:   - Pain began >10 years ago  - Her pain is 10/10 at worst. Lumbar DDD.  Patient's pain has been refractory to conservative measures including:  - More than three months of conservative treatment with rest.  - More than three months of reduced activity.   - Trial of physical therapy/chiropractic care - Dates: 2019. 12/21 - 6/22 with [facility].  - Health care provider prescribed, self directed, independent exercise program performed daily.   - Patient has failed to respond to NSAID > 3 weeks  and > 6 weeks activity modification.  - Trial of medications including: tramadol, acetaminophen, cyclobenzaprine.  -Impact on activities of daily living: The pain is significantly impacting quality life and ability to participate in activities of daily living including; cooking, cleaning, household chores, pursuit of hobbies and gainful employment.  -Tenderness to palpation over the bilateral sacroiliac joints, Faber's test positive bilaterally, hip thrust positive bilaterally, Gaenslen test positive bilaterally, sacroiliac joint compression test positive bilaterally.   -The patient has failed  sacroiliac joint injections, cluneal nerve blocks, facet injections and lumbar surgery.  -She does not want any further lumbar surgery.  Clinical findings and imaging studies suggest no other obvious cause of the pain. History, physical examination, and clinical correlation was performed for the diagnosis of this pain syndrome by an experienced pain medicine provider.  -The VIA Disc L4 on 06/18/21 provided the patient with 80% improvement of pain with improved range of motion and functional activities of daily living including cooking cleaning and household chores.           VIA Disc Intradiscal Therapy:            VIA Disc NP is intended for use as an allograft to supplement degenerative intervertebral discs.  An allograft is tissue recovered from an human cadaveric donor that is transferred to the human recipient. VIA Disc NP consists of dehydrated nucleus pulposus particulate derived from the intervertebral disc region of the donor.  The nucleus pulposus particulate is mixed together with saline and delivered into the intervertebral disc during a nonsurgical spinal procedure. The VIA Disc NP procedure is performed under local anesthesia or moderate sedation.  It is a completely outpatient procedure that does not require operating room time or an overnight hospital stay.  Clinical studies demonstrate 5-year efficacy and improvement in axial lumbosacral back pain and regeneration of degenerated intervertebral disc spaces. The patient was provided a patient-centered brochure today detailing the nature of the cadaveric allograft and the procedural overview.     Beall DP, Wilson GL, Bishop R, Tally W. VAST Clinical Trial: Safely Supplementing Tissue Lost to Degenerative Disc Disease. Int J Spine Surg. 2020 Apr 30;14(2):239-253. doi: 10.14444/7033. PMID: 21624469; PMCID: FQH2257505.   Follow up for procedure      Risks and benefits were explained. Patient has verbalized understanding and agreed to proceed with a  proposed treatment plan.     Patient was encouraged to contact us for any worsening symptoms, persisting symptoms, or any other concerns.  Patient was provided the opportunity to ask questions.  Thank you for allowing [place] the opportunity to care for this patient.     Marie Goodman, M.D.  Chief, Division of  Pain Medicine  Director, Pain Treatment Center  Department of [place] [facility]     Associate Professor  [place] [facility]  [address], [place], [place] [address]  Office: [phone]    Direct: [phone]   Fax: [phone]    Rationale for Separately Identifiable Service (Modifier 25):  Modifier 25 is defined as a significant, separately identifiable evaluation and management (E/M)service by the same physician on the same day of the procedure or other service.   The current medical issue requires considerable workup and treatment, that, if not addressed at todays visit, would require the patient to return for another visit to address it. In particular, extensive medical record review,including prior conservative treatments, prior interventions, efficacy of prior interventions and documentation of prior treatment history was required prior to submitting for the advance procedural intervention.  If not performed at today's visit concurrent with the procedure, this would have required the patient to return for a separate visit on a separate day.  Therefore in order to prevent further delays in treatment and to expedite services, the E/M visit was performed concurrent with today's procedure.

## 2021-06-19 NOTE — Procedures (Signed)
Date/Time: 06/18/2021 10:20 AM EDT  Procedure: VIA Disc Intradiscal 1st level - CPT 0627T  Laterality: Midline    VIA Disc Procedure:     Percutaneous injection of allogeneic disc tissue matrix into the nucleus of the lumbar intervertebral disc under image guidance    @  06/19/2021  Corky Sox, [id number]    Diagnosis: Lumbar degenerative disc disease    Imaging: Fluoroscopy    Anesthesia: Local with intravenous sedation    Location: L4    Treating physician: Dixie Dials, MD    Procedure: A thorough discussion was had with the patient regarding risks, benefits, and alternatives to the above-named procedure.  The patient was informed about the risks and related risks of discitis, neural injury, bleeding, etc. the patient was provided with intravenous antibiotics for prophylaxis.  All questions were answered to the patient's satisfaction.  The patient agreed to proceed with a written informed consent was obtained with a witness present.  A universal timeout protocol was performed per the usual clinical fashion.    After fully informed written consent was obtained, the patient was placed prone on the fluoroscopy table.  Proper padding and adequate positioning were achieved with bolsters, pillows and cushions.  The lumbar spine was prepped with chlorhexidine x2 and draped in the usual standard sterile fashion.  The intended trajectory was mapped by bringing the facet joint 30% across the line endplate of the targeted lumbar vertebrae of L4.  Kambin's triangle was identified to avoid the potential target of the descending nerve root exiting from the foramen of the affected disc level being treated.  The visualization of the foramen was noted to be open and spacious to allow the instrument to be introduced to the annulus of the affected target, consistent live fluoroscopic guidance was used to avoid the nerve root and vascular structures adjacent to the affected disc level.      The superolateral aspect of the  pedicle was marked.  The skin was anesthetized with 3 cc of 1% lidocaine using a 27-gauge 1-1/2 inch needle.  An 18-gauge introducer was placed in the skin in a coaxial manner towards the affected disc.  Then a curved 22-gauge 7 inch needle was introduced through the introducer.  Once passed the superior articulating process, entering Kambin's triangle and avoiding the exiting nerve root from the foramen, a lateral and AP fluoroscopic view was used to monitor their the needle into the disc under live fluoroscopic guidance.  The patient was able to communicate well under sedation the entire time to warn and avoid any neural irritation or any paresthesia during the procedure    The instrument was introduced into the disc under fluoroscopic guidance.  Once approximately 50% across under AP view and 50% across under lateral view we turned our attention to the preparation of the via disc implant.  In a sterile manner and technique the mixing device containing the disc tissue particulate was used to appropriately mixed the via disc tissue component with a viable cell component after thawing, together with sterile saline to achieve the proper total contents to be administered and the disc being treated.  A total volume of 2 ml of sterile saline coupled with allograft was injected into the disc under a low pressure nonpainful nonparesthetic fashion.  The needle was removed.    The puncture site was closed using Steri-Strips.  There were no apparent complications during the procedure.  The patient was transported to the recovery area in stable condition.  The patient  was ambulatory upon discharge.

## 2021-07-09 ENCOUNTER — Ambulatory Visit: Payer: Medicare (Managed Care) | Attending: Pain Medicine | Admitting: Pain Medicine

## 2021-07-09 ENCOUNTER — Encounter: Payer: Self-pay | Admitting: Pain Medicine

## 2021-07-09 ENCOUNTER — Ambulatory Visit
Admission: RE | Admit: 2021-07-09 | Discharge: 2021-07-09 | Disposition: A | Discharge status: Home / Self Care | Payer: Medicare (Managed Care) | Source: Ambulatory Visit | Attending: Pain Medicine | Admitting: Pain Medicine

## 2021-07-09 ENCOUNTER — Other Ambulatory Visit: Payer: Self-pay

## 2021-07-09 ENCOUNTER — Other Ambulatory Visit: Payer: Self-pay | Admitting: Pain Medicine

## 2021-07-09 VITALS — BP 166/69 | HR 77 | Temp 97.2°F | Resp 18

## 2021-07-09 DIAGNOSIS — R52 Pain, unspecified: Secondary | ICD-10-CM | POA: Diagnosis not present

## 2021-07-09 DIAGNOSIS — M5136 Other intervertebral disc degeneration, lumbar region: Secondary | ICD-10-CM | POA: Diagnosis not present

## 2021-07-09 MED ORDER — CEFAZOLIN SODIUM 1000 MG IJ SOLR *I*
2000.0000 mg | Freq: Once | INTRAMUSCULAR | Status: AC
Start: 2021-07-09 — End: 2021-07-09
  Administered 2021-07-09: 2000 mg via INTRAVENOUS

## 2021-07-09 MED ORDER — FENTANYL CITRATE 50 MCG/ML IJ SOLN *WRAPPED*
100.0000 ug | Freq: Once | INTRAMUSCULAR | Status: AC
Start: 2021-07-09 — End: 2021-07-09
  Administered 2021-07-09: 100 ug via INTRAVENOUS

## 2021-07-09 MED ORDER — CEFAZOLIN SODIUM 1000 MG IJ SOLR *I*
INTRAMUSCULAR | Status: AC
Start: 2021-07-09 — End: 2021-07-09
  Filled 2021-07-09: qty 20

## 2021-07-09 MED ORDER — SODIUM CHLORIDE 0.9 % IV SOLN WRAPPED *I*
30.0000 mL/h | Status: AC
Start: 2021-07-09 — End: 2021-07-09
  Administered 2021-07-09: 30 mL/h via INTRAVENOUS

## 2021-07-09 MED ORDER — FENTANYL CITRATE 50 MCG/ML IJ SOLN *WRAPPED*
INTRAMUSCULAR | Status: AC
Start: 2021-07-09 — End: 2021-07-09
  Filled 2021-07-09: qty 2

## 2021-07-09 NOTE — Progress Notes (Signed)
Patient was examined prior to this procedure.      Change in health status: No      Interval change in structure, distribution quality of pain: No      Interval Change:   Medication Allergies:  No   Contrast Dye Allergies: No   Latex Allergies:  No   Current Antibiotics:  No   Current Blood Thinners: No   Recent Fevers/Chills:  No   History of Diabetes:  No   Current Steroids:  No   NPO:    Yes      Plan: proceed with VIA Disc L5

## 2021-07-09 NOTE — Patient Instructions (Signed)
[facility]  Discharge Instructions  Via-Disc    Via-Disc is intended for use as an allograft to supplement degenerated intervertebral discs.  An allograft is tissue recovered from a human cadaveric donor that is transferred to a human recipient.  Via Disc consists of dehydrated nucleus pulposus particulate derived from the intervertebral disc region of the donor.  The nucleus pulposus particulate is mixed together with saline and delivered into your intervertebral disc during a non-surgical spinal procedure.          The following may occur after the procedure:  1.  Pain that may last several hours after the procedure will subside or return to baseline  2. Pain or soreness at the puncture sites    ICE: For injection site tenderness, apply an ice pack to the injection site for 20 minute period, then remove for one hour.  Repeat as necessary.    Showering: Do not shower or submerge the injection site in water (pool, hot tub, bath tub, etc) for 48 hours.  You may remove the band-aid after 48 hours.      Activity:  Avoid any strenuous activity for 3-4 days, this includes bending, twisting, squatting, or flexing.  Begin to increase your daily activities slowly as indicated by your physician.        NO DRIVING FOR 24 HOURS FOLLOWING YOUR VIA-DISC PROCEDURE.   If you have received any medication to help you relax and for pain, you should not drive, operate heavy machinery or make major decisions for 24 hours.        Resume previous medications as ordered by your physician.  Drink 6-8 glasses of fluids each day to help flush the dye out of your body.      Notify the Pain Treatment Center for the following:  1.  Fever, chills, redness, swelling, drainage at the injection sites  2. Unusual/severe pain that is different from immediately before the procedure.      If you experience any other symptoms or have concerns you think are related to this procedure, contact the Pain Treatment  Center at [phone].

## 2021-07-09 NOTE — Progress Notes (Signed)
Reviewed discharge instructions with patient. verbalized understanding and written copy given to pt.

## 2021-07-10 NOTE — Procedures (Signed)
Date/Time: 07/09/2021  8:00 AM EDT  Procedure: VIA Disc Intradiscal 1st level - CPT 0627T  Laterality: Midline    VIA Disc Procedure:     Percutaneous injection of allogeneic disc tissue matrix into the nucleus of the lumbar intervertebral disc under image guidance    @fentaNYL  (SUBLIMAZE) 50 mcg/mL injection 100 mcg     Date Action Dose Route User    07/09/2021 0840 Given 100 mcg Intravenous 09/08/2021, RN      07/10/2021  09/09/2021, Corky Sox    Diagnosis: Lumbar degenerative disc disease    Imaging: Fluoroscopy    Anesthesia: Local with intravenous sedation    Location: L5    Treating physician: [id number], MD    Procedure: A thorough discussion was had with the patient regarding risks, benefits, and alternatives to the above-named procedure.  The patient was informed about the risks and related risks of discitis, neural injury, bleeding, etc. the patient was provided with intravenous antibiotics for prophylaxis.  All questions were answered to the patient's satisfaction.  The patient agreed to proceed with a written informed consent was obtained with a witness present.  A universal timeout protocol was performed per the usual clinical fashion.    After fully informed written consent was obtained, the patient was placed prone on the fluoroscopy table.  Proper padding and adequate positioning were achieved with bolsters, pillows and cushions.  The lumbar spine was prepped with chlorhexidine x2 and draped in the usual standard sterile fashion.  The intended trajectory was mapped by bringing the facet joint 30% across the line endplate of the targeted lumbar vertebrae of L5.  Kambin's triangle was identified to avoid the potential target of the descending nerve root exiting from the foramen of the affected disc level being treated.  The visualization of the foramen was noted to be open and spacious to allow the instrument to be introduced to the annulus of the affected target, consistent live  fluoroscopic guidance was used to avoid the nerve root and vascular structures adjacent to the affected disc level.      The superolateral aspect of the pedicle was marked.  The skin was anesthetized with 3 cc of 1% lidocaine using a 27-gauge 1-1/2 inch needle.  An 18-gauge introducer was placed in the skin in a coaxial manner towards the affected disc.  Then a curved 22-gauge 7 inch needle was introduced through the introducer.  Once passed the superior articulating process, entering Kambin's triangle and avoiding the exiting nerve root from the foramen, a lateral and AP fluoroscopic view was used to monitor their the needle into the disc under live fluoroscopic guidance.  The patient was able to communicate well under sedation the entire time to warn and avoid any neural irritation or any paresthesia during the procedure    The instrument was introduced into the disc under fluoroscopic guidance.  Once approximately 50% across under AP view and 50% across under lateral view we turned our attention to the preparation of the via disc implant.  In a sterile manner and technique the mixing device containing the disc tissue particulate was used to appropriately mixed the via disc tissue component with a viable cell component after thawing, together with sterile saline to achieve the proper total contents to be administered and the disc being treated.  A total volume of 2 ml of sterile saline coupled with allograft was injected into the disc under a low pressure nonpainful nonparesthetic fashion.  The needle was removed.  The puncture site was closed using Steri-Strips.  There were no apparent complications during the procedure.  The patient was transported to the recovery area in stable condition.  The patient was ambulatory upon discharge.

## 2021-07-20 ENCOUNTER — Ambulatory Visit: Payer: Medicare (Managed Care) | Admitting: Pain Medicine

## 2021-07-30 DIAGNOSIS — E559 Vitamin D deficiency, unspecified: Secondary | ICD-10-CM | POA: Diagnosis not present

## 2021-07-30 DIAGNOSIS — J45909 Unspecified asthma, uncomplicated: Secondary | ICD-10-CM | POA: Diagnosis not present

## 2021-07-30 DIAGNOSIS — I10 Essential (primary) hypertension: Secondary | ICD-10-CM | POA: Diagnosis not present

## 2021-07-30 DIAGNOSIS — K219 Gastro-esophageal reflux disease without esophagitis: Secondary | ICD-10-CM | POA: Diagnosis not present

## 2021-07-30 DIAGNOSIS — E1140 Type 2 diabetes mellitus with diabetic neuropathy, unspecified: Secondary | ICD-10-CM | POA: Diagnosis not present

## 2021-07-30 DIAGNOSIS — M5136 Other intervertebral disc degeneration, lumbar region: Secondary | ICD-10-CM | POA: Diagnosis not present

## 2021-07-30 DIAGNOSIS — E785 Hyperlipidemia, unspecified: Secondary | ICD-10-CM | POA: Diagnosis not present

## 2021-07-30 DIAGNOSIS — R600 Localized edema: Secondary | ICD-10-CM | POA: Diagnosis not present

## 2021-07-30 DIAGNOSIS — F32A Depression, unspecified: Secondary | ICD-10-CM | POA: Diagnosis not present

## 2021-07-30 DIAGNOSIS — Z139 Encounter for screening, unspecified: Secondary | ICD-10-CM | POA: Diagnosis not present

## 2021-07-30 DIAGNOSIS — Z9181 History of falling: Secondary | ICD-10-CM | POA: Diagnosis not present

## 2021-08-04 DIAGNOSIS — E1140 Type 2 diabetes mellitus with diabetic neuropathy, unspecified: Secondary | ICD-10-CM | POA: Diagnosis not present

## 2021-08-24 DIAGNOSIS — R748 Abnormal levels of other serum enzymes: Secondary | ICD-10-CM | POA: Diagnosis not present

## 2021-09-01 DIAGNOSIS — F1190 Opioid use, unspecified, uncomplicated: Secondary | ICD-10-CM

## 2021-09-01 DIAGNOSIS — Z0189 Encounter for other specified special examinations: Secondary | ICD-10-CM | POA: Diagnosis not present

## 2021-09-01 DIAGNOSIS — Z0289 Encounter for other administrative examinations: Secondary | ICD-10-CM | POA: Insufficient documentation

## 2021-09-01 DIAGNOSIS — G8929 Other chronic pain: Secondary | ICD-10-CM | POA: Diagnosis not present

## 2021-09-01 DIAGNOSIS — M4306 Spondylolysis, lumbar region: Secondary | ICD-10-CM

## 2021-09-01 DIAGNOSIS — M5136 Other intervertebral disc degeneration, lumbar region: Secondary | ICD-10-CM

## 2021-09-01 HISTORY — DX: Spondylolysis, lumbar region: M43.06

## 2021-09-01 HISTORY — DX: Encounter for other administrative examinations: Z02.89

## 2021-09-01 HISTORY — DX: Other intervertebral disc degeneration, lumbar region: M51.36

## 2021-09-01 HISTORY — DX: Opioid use, unspecified, uncomplicated: F11.90

## 2021-09-14 ENCOUNTER — Other Ambulatory Visit: Payer: Self-pay

## 2021-09-14 DIAGNOSIS — F32A Depression, unspecified: Secondary | ICD-10-CM | POA: Insufficient documentation

## 2021-09-14 DIAGNOSIS — M810 Age-related osteoporosis without current pathological fracture: Secondary | ICD-10-CM | POA: Insufficient documentation

## 2021-09-14 DIAGNOSIS — K5792 Diverticulitis of intestine, part unspecified, without perforation or abscess without bleeding: Secondary | ICD-10-CM | POA: Insufficient documentation

## 2021-09-14 DIAGNOSIS — E119 Type 2 diabetes mellitus without complications: Secondary | ICD-10-CM | POA: Insufficient documentation

## 2021-09-14 DIAGNOSIS — R21 Rash and other nonspecific skin eruption: Secondary | ICD-10-CM | POA: Insufficient documentation

## 2021-09-14 DIAGNOSIS — G8929 Other chronic pain: Secondary | ICD-10-CM | POA: Insufficient documentation

## 2021-09-14 DIAGNOSIS — R609 Edema, unspecified: Secondary | ICD-10-CM | POA: Insufficient documentation

## 2021-09-14 DIAGNOSIS — E1140 Type 2 diabetes mellitus with diabetic neuropathy, unspecified: Secondary | ICD-10-CM | POA: Insufficient documentation

## 2021-09-14 DIAGNOSIS — E785 Hyperlipidemia, unspecified: Secondary | ICD-10-CM | POA: Insufficient documentation

## 2021-09-14 DIAGNOSIS — R5383 Other fatigue: Secondary | ICD-10-CM | POA: Insufficient documentation

## 2021-09-14 DIAGNOSIS — F419 Anxiety disorder, unspecified: Secondary | ICD-10-CM | POA: Insufficient documentation

## 2021-09-14 DIAGNOSIS — K219 Gastro-esophageal reflux disease without esophagitis: Secondary | ICD-10-CM | POA: Insufficient documentation

## 2021-09-14 DIAGNOSIS — K5900 Constipation, unspecified: Secondary | ICD-10-CM | POA: Insufficient documentation

## 2021-09-14 DIAGNOSIS — M545 Low back pain, unspecified: Secondary | ICD-10-CM | POA: Insufficient documentation

## 2021-09-14 DIAGNOSIS — E669 Obesity, unspecified: Secondary | ICD-10-CM | POA: Insufficient documentation

## 2021-09-14 DIAGNOSIS — I10 Essential (primary) hypertension: Secondary | ICD-10-CM | POA: Insufficient documentation

## 2021-09-14 DIAGNOSIS — Z780 Asymptomatic menopausal state: Secondary | ICD-10-CM | POA: Insufficient documentation

## 2021-09-14 DIAGNOSIS — R635 Abnormal weight gain: Secondary | ICD-10-CM | POA: Insufficient documentation

## 2021-09-14 DIAGNOSIS — E559 Vitamin D deficiency, unspecified: Secondary | ICD-10-CM | POA: Insufficient documentation

## 2021-09-14 DIAGNOSIS — J45909 Unspecified asthma, uncomplicated: Secondary | ICD-10-CM | POA: Insufficient documentation

## 2021-09-14 DIAGNOSIS — M1990 Unspecified osteoarthritis, unspecified site: Secondary | ICD-10-CM | POA: Insufficient documentation

## 2021-09-22 NOTE — Progress Notes (Deleted)
Cardiology Office Note:    Date:  09/22/2021   ID:  Kristin Flores, DOB [birth year], MRN [id number]  PCP:  Kristin Dress, MD  Cardiologist:  Kristin More, MD   Referring MD: Kristin Iba, NP  ASSESSMENT:    No diagnosis found. PLAN:    In order of problems listed above:  ***  Next appointment   Medication Adjustments/Labs and Tests Ordered: Current medicines are reviewed at length with the patient today.  Concerns regarding medicines are outlined above.  No orders of the defined types were placed in this encounter.  No orders of the defined types were placed in this encounter.    No chief complaint on file. ***  History of Present Illness:    Kristin Flores is a 75 y.o. female with a history of hypertension hyperlipidemia who is being seen today for the evaluation of lower extremity edema at the request of Kristin Flores, Kristin Oppenheim, NP. Med review shows that she is taking nonsteroidal anti-inflammatory drug calcium channel blocker and Requip.  Past Medical History:  Diagnosis Date   Abnormal weight gain    Anxiety and depression    Arthritis of left subtalar joint 10/11/2017   Asthma    Chronic left SI joint pain 02/16/2017   Chronic lumbar pain    Chronic pain of left ankle 02/07/2017   Constipation    Diabetes mellitus without complication (HCC)    borderline   Diverticulitis    Edema    Fibromyalgia    Fibromyalgia    GERD (gastroesophageal reflux disease)    History of ankle fusion 04/04/2017   Hyperlipidemia    Hypertension    Malaise and fatigue    Obesity    Osteoarthritis    Osteoporosis, postmenopausal    Other fatigue 02/16/2017   Pain in joint, lower leg 12/25/2015   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     Allergies:   Codeine   Social History   Socioeconomic History   Marital status: Married    Spouse name: Not on file   Number of children: Not on file   Years of education: Not on file   Highest education level: Not on file  Occupational History   Not on file  Tobacco Use   Smoking status: Never   Smokeless tobacco: Never  Vaping Use   Vaping Use: Never used  Substance and Sexual Activity   Alcohol use: No   Drug use: No   Sexual activity: Not on file  Other Topics Concern   Not on file  Social History Narrative   Not on file   Social Determinants of Health   Financial Resource Strain: Not on file  Food Insecurity: Not on file  Transportation Needs: Not on file  Physical Activity:  Not on file  Stress: Not on file  Social Connections: Not on file     Family History: The patient's ***family history includes COPD in her brother; Emphysema in her brother and father; Heart Problems in her brother; Heart attack in her paternal grandfather; Hypertension in her brother; Lung cancer in her father and sister; Lymphoma in her mother; Stroke in her maternal grandfather.  ROS:   ROS Please see the history of present illness.    *** All other systems reviewed and are negative.  EKGs/Labs/Other Studies Reviewed:    The following studies were reviewed today: ***  EKG:  EKG is *** ordered today.  The ekg ordered today is personally reviewed and demonstrates ***  Recent Labs: No results found for requested labs within last 8760 hours.  Recent Lipid  Panel No results found for: CHOL, TRIG, HDL, CHOLHDL, VLDL, LDLCALC, LDLDIRECT  Physical Exam:    VS:  There were no vitals taken for this visit.    Wt Readings from Last 3 Encounters:  07/27/18 251 lb (113.9 kg)  02/22/17 256 lb (116.1 kg)  03/08/16 248 lb 14.4 oz (112.9 kg)     GEN: *** Well nourished, well developed in no acute distress HEENT: Normal NECK: No JVD; No carotid bruits LYMPHATICS: No lymphadenopathy CARDIAC: ***RRR, no murmurs, rubs, gallops RESPIRATORY:  Clear to auscultation without rales, wheezing or rhonchi  ABDOMEN: Soft, non-tender, non-distended MUSCULOSKELETAL:  No edema; No deformity  SKIN: Warm and dry NEUROLOGIC:  Alert and oriented x 3 PSYCHIATRIC:  Normal affect     Signed, Kristin More, MD  09/22/2021 2:39 PM    [place]

## 2021-09-23 ENCOUNTER — Ambulatory Visit: Payer: Medicare Other | Admitting: Cardiology

## 2021-10-13 DIAGNOSIS — Z79899 Other long term (current) drug therapy: Secondary | ICD-10-CM | POA: Diagnosis not present

## 2021-10-13 DIAGNOSIS — M5136 Other intervertebral disc degeneration, lumbar region: Secondary | ICD-10-CM | POA: Diagnosis not present

## 2021-10-13 DIAGNOSIS — Z760 Encounter for issue of repeat prescription: Secondary | ICD-10-CM | POA: Diagnosis not present

## 2021-10-13 DIAGNOSIS — Z79891 Long term (current) use of opiate analgesic: Secondary | ICD-10-CM | POA: Diagnosis not present

## 2021-10-13 DIAGNOSIS — M4306 Spondylolysis, lumbar region: Secondary | ICD-10-CM | POA: Diagnosis not present

## 2021-10-13 DIAGNOSIS — G8929 Other chronic pain: Secondary | ICD-10-CM | POA: Diagnosis not present

## 2021-12-08 DIAGNOSIS — J45909 Unspecified asthma, uncomplicated: Secondary | ICD-10-CM | POA: Diagnosis not present

## 2021-12-08 DIAGNOSIS — E538 Deficiency of other specified B group vitamins: Secondary | ICD-10-CM | POA: Diagnosis not present

## 2021-12-08 DIAGNOSIS — I10 Essential (primary) hypertension: Secondary | ICD-10-CM | POA: Diagnosis not present

## 2021-12-08 DIAGNOSIS — E559 Vitamin D deficiency, unspecified: Secondary | ICD-10-CM | POA: Diagnosis not present

## 2021-12-08 DIAGNOSIS — E785 Hyperlipidemia, unspecified: Secondary | ICD-10-CM | POA: Diagnosis not present

## 2021-12-08 DIAGNOSIS — E1140 Type 2 diabetes mellitus with diabetic neuropathy, unspecified: Secondary | ICD-10-CM | POA: Diagnosis not present

## 2021-12-08 DIAGNOSIS — R600 Localized edema: Secondary | ICD-10-CM | POA: Diagnosis not present

## 2022-01-14 DIAGNOSIS — J208 Acute bronchitis due to other specified organisms: Secondary | ICD-10-CM | POA: Diagnosis not present

## 2022-01-14 NOTE — Progress Notes (Signed)
?Cardiology Office Note:   ? ?Date:  01/15/2022  ? ?ID:  Kristin Flores, DOB 05/12/1946, MRN [id number] ? ?PCP:  Nicoletta Dress, MD  ?Cardiologist:  Shirlee More, MD  ? ?Referring MD: Earlyne Iba, NP ? ?ASSESSMENT:   ? ?1. Bilateral edema of lower extremity   ?2. Primary hypertension   ?3. Mixed hyperlipidemia   ?4. Diabetes mellitus without complication ([place])   ? ?PLAN:   ? ?In order of problems listed above: ? ?Her clinical problem is longstanding lower extremity edema I think she has a combination of chronic venous insufficiency and drug-induced with Requip and calcium channel blocker amlodipine.  We will stop her calcium channel blocker and MRA as a diuretic for BP control trend her blood pressure at home bring a list next office visit.  Also check echocardiogram at increased risk of heart failure diabetes and hypertension and screen for pulmonary hypertension. ?With her marked lower extremity edema she will discontinue calcium channel blocker continue ACE inhibitor and thiazide diuretic initiate MRA.  She will trend and record blood pressures ?Continue her high intensity statin with diabetes ?Stable continue current treatment managed by her PCP ? ?Next appointment 2 months ? ? ?Medication Adjustments/Labs and Tests Ordered: ?Current medicines are reviewed at length with the patient today.  Concerns regarding medicines are outlined above.  ?Orders Placed This Encounter  ?Procedures  ? EKG 12-Lead  ? ECHOCARDIOGRAM COMPLETE  ? ?Meds ordered this encounter  ?Medications  ? spironolactone (ALDACTONE) 25 MG tablet  ?  Sig: Take 1 tablet (25 mg total) by mouth daily.  ?  Dispense:  90 tablet  ?  Refill:  3  ?  ? ?Chief Complaint  ?Patient presents with  ? Leg Swelling  ? ? ?History of Present Illness:   ? ?Kristin Flores is a 76 y.o. female with a history of type 2 diabetes hypertension  who is being seen today for the evaluation of edema at the request of Potts, Georgeann Oppenheim, NP. ?Chart review shows no  previous cardiology evaluation or diagnostic cardiac imaging. ?Review shows that she takes both amlodipine and Requip both of which can be associated with marked edema. ? ?Her sister is present participates in evaluation decision making ?She no longer takes Requip and I do not know how long she has been off of it. ?She continues to take amlodipine. ?She has a long-term history of lower extremity edema better in the morning worse in the evening and relieved with elevation ?She has a support hose ?She has no known history of heart or kidney disease ?She has no shortness of breath orthopnea chest pain palpitation or syncope. ?She is under intense personal stress because her husband's dementia and home care ?She restricts sodium in her diet ?She has no known history of deep vein thrombosis. ?She had evaluation of lower extremities duplex 2014 showed no findings of thrombophlebitis but she had a complex Baker's cyst in the right popliteal fossa ?Past Medical History:  ?Diagnosis Date  ? Abnormal weight gain   ? Anxiety and depression   ? Arthritis of left subtalar joint 10/11/2017  ? Asthma   ? Chronic left SI joint pain 02/16/2017  ? Chronic lumbar pain   ? Chronic pain of left ankle 02/07/2017  ? Constipation   ? Diabetes mellitus without complication ([place])   ? borderline  ? Diverticulitis   ? Edema   ? Fibromyalgia   ? Fibromyalgia   ? GERD (gastroesophageal reflux disease)   ?  History of ankle fusion 04/04/2017  ? Hyperlipidemia   ? Hypertension   ? Malaise and fatigue   ? Obesity   ? Osteoarthritis   ? Osteoporosis, postmenopausal   ? Other fatigue 02/16/2017  ? Pain in joint, lower leg 12/25/2015  ? Postmenopausal   ? Presence of right artificial knee joint 03/23/2016  ? Primary insomnia 02/16/2017  ? Rash of unknown cause   ? 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 Sciatica, left side 06/27/2017  ? Type 2 diabetes, controlled, with neuropathy ([place])   ? Vitamin D deficiency   ? ? ?Past Surgical History:  ?Procedure Laterality  Date  ? ANKLE FUSION Left   ? CHOLECYSTECTOMY    ? COLONOSCOPY    ? TOTAL KNEE ARTHROPLASTY Right 03/08/2016  ? Procedure: TOTAL KNEE ARTHROPLASTY;  Surgeon: Vickey Huger, MD;  Location: [facility];  Service: Orthopedics;  Laterality: Right;  ? ? ?Current Medications: ?Current Meds  ?Medication Sig  ? albuterol (PROVENTIL HFA;VENTOLIN HFA) 108 (90 Base) MCG/ACT inhaler Inhale 1-2 puffs into the lungs every 6 (six) hours as needed for wheezing or shortness of breath.  ? alendronate (FOSAMAX) 70 MG tablet Take 70 mg by mouth once a week.  ? aspirin EC 81 MG tablet Take 81 mg by mouth at bedtime.   ? benazepril-hydrochlorthiazide (LOTENSIN HCT) 20-25 MG tablet Take 1 tablet by mouth daily.  ? celecoxib (CELEBREX) 200 MG capsule Take 200 mg by mouth daily.  ? cyclobenzaprine (FLEXERIL) 10 MG tablet Take 10 mg by mouth daily.   ? Dulaglutide (TRULICITY) 2.83 MO/2.9UT SOPN Inject 0.75 mg into the skin once a week.  ? ergocalciferol (VITAMIN D2) 1.25 MG (50000 UT) capsule Take 50,000 Units by mouth once a week.  ? escitalopram (LEXAPRO) 10 MG tablet Take 10 mg by mouth daily.  ? ketoconazole (NIZORAL) 2 % cream Apply 1 application topically 2 (two) times daily.  ? metFORMIN (GLUCOPHAGE-XR) 500 MG 24 hr tablet Take 500 mg by mouth 2 (two) times daily.   ? mometasone-formoterol (DULERA) 200-5 MCG/ACT AERO Inhale 2 puffs into the lungs 2 (two) times daily.  ? omeprazole (PRILOSEC) 40 MG capsule Take 40 mg by mouth daily.  ? rosuvastatin (CRESTOR) 10 MG tablet Take 10 mg by mouth every evening.  ? spironolactone (ALDACTONE) 25 MG tablet Take 1 tablet (25 mg total) by mouth daily.  ? traMADol (ULTRAM) 50 MG tablet Take by mouth every 8 (eight) hours as needed for pain.  ? [DISCONTINUED] amLODipine (NORVASC) 5 MG tablet Take 5 mg by mouth daily.  ?  ? ?Allergies:   Codeine  ? ?Social History  ? ?Socioeconomic History  ? Marital status: Married  ?  Spouse name: Not on file  ? Number of children: Not on file  ? Years of education: Not on  file  ? Highest education level: Not on file  ?Occupational History  ? Not on file  ?Tobacco Use  ? Smoking status: Never  ?  Passive exposure: Past  ? Smokeless tobacco: Never  ?Vaping Use  ? Vaping Use: Never used  ?Substance and Sexual Activity  ? Alcohol use: No  ? Drug use: No  ? Sexual activity: Not on file  ?Other Topics Concern  ? Not on file  ?Social History Narrative  ? Not on file  ? ?Social Determinants of Health  ? ?Financial Resource Strain: Not on file  ?Food Insecurity: Not on file  ?Transportation Needs: Not on file  ?Physical Activity: Not on file  ?  Stress: Not on file  ?Social Connections: Not on file  ?  ? ?Family History: ?The patient's family history includes COPD in her brother; Emphysema in her brother and father; Heart Problems in her brother; Heart attack in her paternal grandfather; Hypertension in her brother; Lung cancer in her father and sister; Lymphoma in her mother; Stroke in her maternal grandfather. ? ?ROS:   ?ROS Please see the history of present illness.    ? All other systems reviewed and are negative. ? ?EKGs/Labs/Other Studies Reviewed:   ? ?The following studies were reviewed today: ? ? ?EKG:  EKG is  ordered today.  The ekg ordered today is personally reviewed and demonstrates sinus rhythm left axis deviation otherwise normal EKG ? ?Recent Labs: ?12/08/2021 ?Hemoglobin normal 12.7, renal function normal creatinine 0.78 potassium 4.3. ? ?Physical Exam:   ? ?VS:  BP 138/82 (BP Location: Right Arm)   Pulse 72   Ht '5\' 4"'$  (1.626 m)   Wt 234 lb 12.8 oz (106.5 kg)   SpO2 96%   BMI 40.30 kg/m?    ? ?Wt Readings from Last 3 Encounters:  ?01/15/22 234 lb 12.8 oz (106.5 kg)  ?07/27/18 251 lb (113.9 kg)  ?02/22/17 256 lb (116.1 kg)  ?  ? ?GEN:  Well nourished, well developed in no acute distress ?HEENT: Normal ?NECK: No JVD; No carotid bruits ?LYMPHATICS: No lymphadenopathy ?CARDIAC: RRR, no murmurs, rubs, gallops ?RESPIRATORY:  Clear to auscultation without rales, wheezing or  rhonchi  ?ABDOMEN: Soft, non-tender, non-distended ?MUSCULOSKELETAL: She has daily bilateral 3-4+ lower extremity pitting edema; No deformity  ?SKIN: Warm and dry ?NEUROLOGIC:  Alert and oriented x 3 ?PSYCHIATRI

## 2022-01-15 ENCOUNTER — Ambulatory Visit: Payer: Medicare Other | Admitting: Cardiology

## 2022-01-15 ENCOUNTER — Encounter: Payer: Self-pay | Admitting: Cardiology

## 2022-01-15 VITALS — BP 138/82 | HR 72 | Ht 64.0 in | Wt 234.8 lb

## 2022-01-15 DIAGNOSIS — E119 Type 2 diabetes mellitus without complications: Secondary | ICD-10-CM

## 2022-01-15 DIAGNOSIS — R600 Localized edema: Secondary | ICD-10-CM

## 2022-01-15 DIAGNOSIS — I10 Essential (primary) hypertension: Secondary | ICD-10-CM

## 2022-01-15 DIAGNOSIS — E782 Mixed hyperlipidemia: Secondary | ICD-10-CM | POA: Diagnosis not present

## 2022-01-15 MED ORDER — SPIRONOLACTONE 25 MG PO TABS: 25.0000 mg | ORAL_TABLET | Freq: Every day | ORAL | 3 refills | Status: DC

## 2022-01-15 NOTE — Patient Instructions (Signed)
Medication Instructions: Your physician has recommended you make the following change in your medication:  ?Discontinue Amlodipine ?Start Spironolactone 25 mg once daily ? ? ?*If you need a refill on your cardiac medications before your next appointment, please call your pharmacy* ? ? ?Lab Work: ?NONE ?If you have labs (blood work) drawn today and your tests are completely normal, you will receive your results only by: ?MyChart Message (if you have MyChart) OR ?A paper copy in the mail ?If you have any lab test that is abnormal or we need to change your treatment, we will call you to review the results. ? ? ?Testing/Procedures: ?Your physician has requested that you have an echocardiogram. Echocardiography is a painless test that uses sound waves to create images of your heart. It provides your doctor with information about the size and shape of your heart and how well your heart?s chambers and valves are working. This procedure takes approximately one hour. There are no restrictions for this procedure.  ? ? ?Follow-Up: ?At [facility], you and your health needs are our priority.  As part of our continuing mission to provide you with exceptional heart care, we have created designated Provider Care Teams.  These Care Teams include your primary Cardiologist (physician) and Advanced Practice Providers (APPs -  Physician Assistants and Nurse Practitioners) who all work together to provide you with the care you need, when you need it. ? ?We recommend signing up for the patient portal called "MyChart".  Sign up information is provided on this After Visit Summary.  MyChart is used to connect with patients for Virtual Visits (Telemedicine).  Patients are able to view lab/test results, encounter notes, upcoming appointments, etc.  Non-urgent messages can be sent to your provider as well.   ?To learn more about what you can do with MyChart, go to [email or web address].   ? ?Your next appointment:   ?6 week(s) ? ?The  format for your next appointment:   ?In Person ? ?Provider:   ?Shirlee More, MD  ? ? ?Other Instructions ?Check BP daily at home and record results ? ?Important Information About Sugar ? ? ? ? ?  ?

## 2022-01-19 DIAGNOSIS — Z791 Long term (current) use of non-steroidal anti-inflammatories (NSAID): Secondary | ICD-10-CM | POA: Diagnosis not present

## 2022-01-19 DIAGNOSIS — M5136 Other intervertebral disc degeneration, lumbar region: Secondary | ICD-10-CM | POA: Diagnosis not present

## 2022-01-19 DIAGNOSIS — Z5181 Encounter for therapeutic drug level monitoring: Secondary | ICD-10-CM | POA: Diagnosis not present

## 2022-01-19 DIAGNOSIS — G8929 Other chronic pain: Secondary | ICD-10-CM | POA: Diagnosis not present

## 2022-01-19 DIAGNOSIS — Z79891 Long term (current) use of opiate analgesic: Secondary | ICD-10-CM | POA: Diagnosis not present

## 2022-01-19 DIAGNOSIS — Z760 Encounter for issue of repeat prescription: Secondary | ICD-10-CM | POA: Diagnosis not present

## 2022-01-25 ENCOUNTER — Ambulatory Visit (INDEPENDENT_AMBULATORY_CARE_PROVIDER_SITE_OTHER): Payer: Medicare Other

## 2022-01-25 DIAGNOSIS — I10 Essential (primary) hypertension: Secondary | ICD-10-CM

## 2022-01-25 DIAGNOSIS — R600 Localized edema: Secondary | ICD-10-CM

## 2022-01-25 LAB — ECHOCARDIOGRAM COMPLETE
Area-P 1/2: 2.28 cm2
S' Lateral: 2.7 cm

## 2022-01-25 MED ORDER — PERFLUTREN LIPID MICROSPHERE
1.0000 mL | INTRAVENOUS | Status: AC | PRN
  Administered 2022-01-25: 5 mL via INTRAVENOUS

## 2022-01-26 ENCOUNTER — Telehealth: Payer: Self-pay | Admitting: *Deleted

## 2022-01-26 NOTE — Telephone Encounter (Signed)
-----   Message from Richardo Priest, MD sent at 01/25/2022  5:01 PM EDT ----- ?Result no findings of heart failure ?

## 2022-01-26 NOTE — Telephone Encounter (Signed)
Phone number in chart is to daughter in [place] but she is not on the [facility]. She will get her mom to call me back at the office. Daughter called back with Mom on the line from here: 3 way call. A DPR will be mailed to pt and she will fill out and send back to [place] with daughter and others that live here.  ?Informed pt of normal echo and she verbalized understanding. ?

## 2022-03-16 DIAGNOSIS — B372 Candidiasis of skin and nail: Secondary | ICD-10-CM | POA: Diagnosis not present

## 2022-03-16 DIAGNOSIS — J45909 Unspecified asthma, uncomplicated: Secondary | ICD-10-CM | POA: Diagnosis not present

## 2022-03-16 DIAGNOSIS — E785 Hyperlipidemia, unspecified: Secondary | ICD-10-CM | POA: Diagnosis not present

## 2022-03-16 DIAGNOSIS — I10 Essential (primary) hypertension: Secondary | ICD-10-CM | POA: Diagnosis not present

## 2022-03-16 DIAGNOSIS — E1140 Type 2 diabetes mellitus with diabetic neuropathy, unspecified: Secondary | ICD-10-CM | POA: Diagnosis not present

## 2022-03-24 DIAGNOSIS — G629 Polyneuropathy, unspecified: Secondary | ICD-10-CM

## 2022-03-24 DIAGNOSIS — M47816 Spondylosis without myelopathy or radiculopathy, lumbar region: Secondary | ICD-10-CM

## 2022-03-24 HISTORY — DX: Spondylosis without myelopathy or radiculopathy, lumbar region: M47.816

## 2022-03-24 HISTORY — DX: Polyneuropathy, unspecified: G62.9

## 2022-03-25 NOTE — Progress Notes (Deleted)
Cardiology Office Note:    Date:  03/25/2022   ID:  Kristin Flores, DOB [birth year], MRN [id number]  PCP:  Nicoletta Dress, MD  Cardiologist:  Shirlee More, MD    Referring MD: Nicoletta Dress, MD    ASSESSMENT:    No diagnosis found. PLAN:    In order of problems listed above:  ***   Next appointment: ***   Medication Adjustments/Labs and Tests Ordered: Current medicines are reviewed at length with the patient today.  Concerns regarding medicines are outlined above.  No orders of the defined types were placed in this encounter.  No orders of the defined types were placed in this encounter.   No chief complaint on file.   History of Present Illness:    Kristin Flores is a 76 y.o. female with a hx of type 2 diabetes hypertension and peripheral edema last seen 01/15/2022.  It was noted that she had chronic venous insufficiency and was taking both Requip and a calcium channel blocker both which can worsen edema. Compliance with diet, lifestyle and medications: ***  Cardiac echo performed 01/25/2022.  Left ventricle shows moderate concentric LVH grade 1 diastolic dysfunction normal systolic function EF 60 to 65%.  Right ventricle is normal in size function and pulmonary artery pressure.  No valvular dysfunction was noted. 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 Patient and I have discussed the hazardous effects of continued opiate pain medication usage. Risks and benefits of above medications including but not limited to possibility of hyperalgesia, respiratory depression, sedation, and even death were discussed with  the patient who expressed an understanding. Patient    Constipation    Diabetes mellitus without complication (HCC)    borderline   Diverticulitis    Edema    Fibromyalgia    Fibromyalgia    GERD (gastroesophageal reflux disease)    History of ankle fusion 04/04/2017   Hyperlipidemia    Hypertension    Malaise and fatigue    Obesity    Osteoarthritis    Osteoporosis, postmenopausal    Other fatigue 02/16/2017   Pain in joint, lower leg 12/25/2015   Pain medication agreement 09/01/2021   Last Assessment & Plan:  Formatting of this note might be different from the original. Agreement up-to-date.   UDS to be collected today in compliance with clinic policies and procedures.   Patient did not display any signs of abuse or diversion and has been checked on the [facility] Controlled Substance website.   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     Allergies:   Codeine   Social History   Socioeconomic History   Marital status: Married    Spouse name: Not on file   Number of children: Not on file   Years of education: Not on file   Highest education level: Not on file  Occupational History   Not on file  Tobacco Use   Smoking status: Never    Passive exposure: Past   Smokeless tobacco: Never  Vaping Use   Vaping Use: Never used   Substance and Sexual Activity   Alcohol use: No   Drug use: No   Sexual activity: Not on file  Other Topics Concern   Not on file  Social History Narrative   Not on file   Social Determinants of Health   Financial Resource Strain: Not on file  Food Insecurity: Not on file  Transportation Needs: Not on file  Physical Activity: Not on file  Stress: Not on file  Social Connections: Not on file     Family History: The patient's ***family history includes COPD in her brother; Emphysema in her brother and father; Heart Problems in her brother; Heart attack in her paternal grandfather; Hypertension in her brother; Lung cancer in her father and sister; Lymphoma in her mother; Stroke in her maternal grandfather. ROS:   Please see the history of present illness.    All other systems reviewed and are negative.  EKGs/Labs/Other Studies Reviewed:    The following studies were reviewed today:  EKG:  EKG ordered today and personally reviewed.  The ekg ordered today demonstrates ***  Recent Labs: No results found for requested labs within last 365 days.  Recent Lipid Panel No results found for: "CHOL", "TRIG", "HDL", "CHOLHDL", "VLDL", "LDLCALC", "LDLDIRECT"  Physical Exam:    VS:  There were no vitals taken for this visit.    Wt Readings from Last 3 Encounters:  01/15/22 234 lb 12.8 oz (106.5 kg)  07/27/18 251 lb (113.9 kg)  02/22/17 256 lb (116.1 kg)     GEN: *** Well nourished, well developed in no acute distress HEENT: Normal NECK: No JVD; No carotid bruits LYMPHATICS: No lymphadenopathy CARDIAC: ***RRR, no murmurs, rubs, gallops RESPIRATORY:  Clear to auscultation without rales, wheezing or rhonchi  ABDOMEN: Soft, non-tender, non-distended MUSCULOSKELETAL:  No edema; No deformity  SKIN: Warm and dry NEUROLOGIC:  Alert and oriented x 3 PSYCHIATRIC:  Normal affect    Signed, Shirlee More, MD  03/25/2022 5:38 PM    [facility]

## 2022-03-26 ENCOUNTER — Ambulatory Visit: Payer: Medicare Other | Admitting: Cardiology

## 2022-04-20 DIAGNOSIS — M4306 Spondylolysis, lumbar region: Secondary | ICD-10-CM | POA: Diagnosis not present

## 2022-04-20 DIAGNOSIS — M5136 Other intervertebral disc degeneration, lumbar region: Secondary | ICD-10-CM | POA: Diagnosis not present

## 2022-04-20 DIAGNOSIS — R413 Other amnesia: Secondary | ICD-10-CM | POA: Diagnosis not present

## 2022-05-05 ENCOUNTER — Other Ambulatory Visit: Payer: Self-pay

## 2022-05-05 DIAGNOSIS — R413 Other amnesia: Secondary | ICD-10-CM | POA: Diagnosis not present

## 2022-05-06 ENCOUNTER — Ambulatory Visit: Payer: Medicare Other | Admitting: Cardiology

## 2022-05-06 ENCOUNTER — Encounter: Payer: Self-pay | Admitting: Physician Assistant

## 2022-05-06 NOTE — Progress Notes (Deleted)
Cardiology Office Note:    Date:  05/06/2022   ID:  Kristin Flores, DOB 06-23-1946, MRN [id number]  PCP:  Kristin Dress, MD  Cardiologist:  Kristin More, MD    Referring MD: Kristin Dress, MD    ASSESSMENT:    No diagnosis found. PLAN:    In order of problems listed above:  ***   Next appointment: ***   Medication Adjustments/Labs and Tests Ordered: Current medicines are reviewed at length with the patient today.  Concerns regarding medicines are outlined above.  No orders of the defined types were placed in this encounter.  No orders of the defined types were placed in this encounter.   No chief complaint on file.   History of Present Illness:    Kristin Flores is a 76 y.o. female with a hx of type 2 diabetes hypertension last seen 01/15/2022 for lower extremity edema in the setting of taking both a calcium channel blocker and Requip both of which can potentiate peripheral edema.  She had evaluation of lower extremities in 2014 showing no signs of thrombophlebitis.. Compliance with diet, lifestyle and medications: ***    1. Left ventricular ejection fraction, by estimation, is 60 to 65%. The  left ventricle has normal function. The left ventricle has no regional  wall motion abnormalities. There is moderate concentric left ventricular  hypertrophy. Left ventricular  diastolic parameters are consistent with Grade I diastolic dysfunction  (impaired relaxation).   2. Right ventricular systolic function is normal. The right ventricular  size is normal. There is normal pulmonary artery systolic pressure.   3. The mitral valve is degenerative. No evidence of mitral valve  regurgitation. No evidence of mitral stenosis.   4. The aortic valve is tricuspid. Aortic valve regurgitation is not  visualized. No aortic stenosis is present.   5. Aortic Normal DTA.   6. The inferior vena cava is normal in size with greater than 50%  respiratory variability,  suggesting right atrial pressure of 3 mmHg.  Yes   Past Surgical History:  Procedure Laterality Date   ANKLE FUSION Left    CHOLECYSTECTOMY     COLONOSCOPY     TOTAL KNEE ARTHROPLASTY Right 03/08/2016   Procedure: TOTAL KNEE ARTHROPLASTY;  Surgeon: Kristin Huger, MD;  Location: [facility];  Service: Orthopedics;  Laterality: Right;    Current Medications: No outpatient medications have been marked as taking for the 05/06/22 encounter (Appointment) with Kristin Priest, MD.     Allergies:   Codeine   Social History   Socioeconomic History   Marital status: Married    Spouse name: Not on file   Number of children: Not on file   Years of education: Not on file   Highest education level: Not on file  Occupational History   Not on file  Tobacco Use   Smoking status: Never    Passive exposure: Past   Smokeless tobacco: Never  Vaping Use   Vaping Use: Never used  Substance and Sexual Activity   Alcohol use: No   Drug use: No   Sexual activity: Not on file  Other Topics Concern   Not on file  Social History Narrative   Not on file   Social Determinants of Health   Financial Resource Strain: Not on file  Food Insecurity: Not on file  Transportation Needs: Not on file  Physical Activity: Not on file  Stress: Not on file  Social Connections: Not on file     Family  History: The patient's ***family history includes COPD in her brother; Emphysema in her brother and father; Heart Problems in her brother; Heart attack in her paternal grandfather; Hypertension in her brother; Lung cancer in her father and sister; Lymphoma in her mother; Stroke in her maternal grandfather. ROS:   Please see the history of present illness.    All other systems reviewed and are negative.  EKGs/Labs/Other Studies Reviewed:    The following studies were reviewed today:  EKG:  EKG ordered today and personally reviewed.  The ekg ordered today demonstrates ***  Recent Labs: No results found for  requested labs within last 365 days.  Recent Lipid Panel No results found for: "CHOL", "TRIG", "HDL", "CHOLHDL", "VLDL", "LDLCALC", "LDLDIRECT"  Physical Exam:    VS:  There were no vitals taken for this visit.    Wt Readings from Last 3 Encounters:  01/15/22 234 lb 12.8 oz (106.5 kg)  07/27/18 251 lb (113.9 kg)  02/22/17 256 lb (116.1 kg)     GEN: *** Well nourished, well developed in no acute distress HEENT: Normal NECK: No JVD; No carotid bruits LYMPHATICS: No lymphadenopathy CARDIAC: ***RRR, no murmurs, rubs, gallops RESPIRATORY:  Clear to auscultation without rales, wheezing or rhonchi  ABDOMEN: Soft, non-tender, non-distended MUSCULOSKELETAL:  No edema; No deformity  SKIN: Warm and dry NEUROLOGIC:  Alert and oriented x 3 PSYCHIATRIC:  Normal affect    Signed, Kristin More, MD  05/06/2022 7:22 AM    [place]

## 2022-05-07 DIAGNOSIS — L723 Sebaceous cyst: Secondary | ICD-10-CM | POA: Diagnosis not present

## 2022-05-07 DIAGNOSIS — L089 Local infection of the skin and subcutaneous tissue, unspecified: Secondary | ICD-10-CM | POA: Diagnosis not present

## 2022-05-14 DIAGNOSIS — L089 Local infection of the skin and subcutaneous tissue, unspecified: Secondary | ICD-10-CM | POA: Diagnosis not present

## 2022-05-14 DIAGNOSIS — L723 Sebaceous cyst: Secondary | ICD-10-CM | POA: Diagnosis not present

## 2022-05-18 ENCOUNTER — Ambulatory Visit: Payer: Medicare Other | Admitting: Physician Assistant

## 2022-05-18 ENCOUNTER — Other Ambulatory Visit (INDEPENDENT_AMBULATORY_CARE_PROVIDER_SITE_OTHER): Payer: Medicare Other

## 2022-05-18 ENCOUNTER — Encounter: Payer: Self-pay | Admitting: Physician Assistant

## 2022-05-18 VITALS — BP 116/81 | Resp 18 | Ht 65.0 in | Wt 223.0 lb

## 2022-05-18 DIAGNOSIS — R413 Other amnesia: Secondary | ICD-10-CM

## 2022-05-18 DIAGNOSIS — G3184 Mild cognitive impairment, so stated: Secondary | ICD-10-CM | POA: Diagnosis not present

## 2022-05-18 NOTE — Progress Notes (Unsigned)
Assessment/Plan:    The patient is seen in neurologic consultation at the request of Potts, Georgeann Oppenheim, NP for the evaluation of memory.  Kristin Flores is a very pleasant 76 y.o. year old RH female with  a history of hypertension, hyperlipidemia, anxiety, depression seen today for evaluation of memory loss. MoCA today is  Recommendations:   Memory Loss   MRI brain without contrast to assess for underlying structural abnormality and assess vascular load  Neurocognitive testing to further evaluate cognitive concerns and determine other underlying cause of memory changes, including potential contribution from sleep, anxiety, or depression  Check B12, TSH Folllow up once results above are available   Subjective:    The patient is accompanied by  who supplements the history.    How long did patient have memory difficulties? This year after h health took a turn for the worse. STM, ppl conv,  ppl she know,  LTM  Patient lives with: with spouse  repeats oneself? Denies .  Disoriented when walking into a room?  Patient denies   Leaving objects in unusual places?  Patient denies   Ambulates  with difficulty?   Uses L cane for balance  Recent falls?  Patient denies   Any head injuries?  Patient denies   History of seizures?   Patient denies   Wandering behavior?  Patient denies   Patient drives?   Denes I dont parallel oark  Any mood changes such irritability agitation?  Patient denies   Any history of depression?:  [place] 5 [place] ago  Hallucinations?  Patient denies   Paranoia?  Patient denies   Patient reports that female  1 y ago have the days and night over the last year .Sleeps well with vivid dreams, REM behavior or sleepwalking    History of sleep apnea?  Patient denies   Any hygiene concerns?  Patient denies  *** Independent of bathing and dressing?  Endorsed  Does the patient needs help with medications? Denies. Her BOL makes the tray  Who is in charge of the finances?   Daughter in control.  Any changes in appetite?  Patient denies   Patient have trouble swallowing? Patient denies   Does the patient cook?  Patient denies   Any kitchen accidents such as leaving the stove on? Patient denies   Any headaches?  Patient denies   Double vision? Patient denies   Any focal numbness or tingling?  Patient denies   Chronic back pain Patient denies   Unilateral weakness?  Patient denies   Any tremors?  Patient denies   Any history of anosmia?  Patient denies   Any incontinence of urine?  Patient denies   Any bowel dysfunction?   Patient denies   History of heavy alcohol intake?  Patient denies   History of heavy tobacco use?  Patient denies   Family history of dementia?  Patient denies    Allergies  Allergen Reactions   Codeine Nausea And Vomiting    "doesn't agree with me"    Current Outpatient Medications  Medication Instructions   albuterol (PROVENTIL HFA;VENTOLIN HFA) 108 (90 Base) MCG/ACT inhaler 1-2 puffs, Inhalation, Every 6 hours PRN   alendronate (FOSAMAX) 70 mg, Oral, Weekly   aspirin EC 81 mg, Daily at bedtime   benazepril-hydrochlorthiazide (LOTENSIN HCT) 20-25 MG tablet 1 tablet, Oral, Daily   celecoxib (CELEBREX) 200 mg, Oral, Daily   cyclobenzaprine (FLEXERIL) 10 mg, Oral, Daily   ergocalciferol (VITAMIN D2) 50,000 Units, Oral,  Weekly   escitalopram (LEXAPRO) 10 mg, Oral, Daily   ketoconazole (NIZORAL) 2 % cream 1 application , Topical, 2 times daily   metFORMIN (GLUCOPHAGE-XR) 500 mg, Oral, 2 times daily   mometasone-formoterol (DULERA) 200-5 MCG/ACT AERO 2 puffs, Inhalation, 2 times daily   omeprazole (PRILOSEC) 40 mg, Oral, Daily   rosuvastatin (CRESTOR) 10 mg, Oral, Every evening   spironolactone (ALDACTONE) 25 mg, Oral, Daily   traMADol (ULTRAM) 50 mg, Oral, Every 8 hours PRN   Trulicity 9.76 mg, Subcutaneous, Weekly     VITALS:  There were no vitals filed for this visit.     No data to display          PHYSICAL EXAM    HEENT:  Normocephalic, atraumatic. The mucous membranes are moist. The superficial temporal arteries are without ropiness or tenderness. Cardiovascular: Regular rate and rhythm. Lungs: Clear to auscultation bilaterally. Neck: There are no carotid bruits noted bilaterally.  NEUROLOGICAL:     No data to display              No data to display           Orientation:  Alert and oriented to person, place and time. No aphasia or dysarthria. Fund of knowledge is appropriate. Recent memory impaired and remote memory intact.  Attention and concentration are normal.  Able to name objects and repeat phrases. Delayed recall   Cranial nerves: There is good facial symmetry. Extraocular muscles are intact and visual fields are full to confrontational testing. Speech is fluent and clear. Soft palate rises symmetrically and there is no tongue deviation. Hearing is intact to conversational tone. Tone: Tone is good throughout. Sensation: Sensation is intact to light touch and pinprick throughout. Vibration is intact at the bilateral big toe.There is no extinction with double simultaneous stimulation. There is no sensory dermatomal level identified. Coordination: The patient has no difficulty with RAM's or FNF bilaterally. Normal finger to nose  Motor: Strength is 5/5 in the bilateral upper and lower extremities. There is no pronator drift. There are no fasciculations noted. DTR's: Deep tendon reflexes are 2/4 at the bilateral biceps, triceps, brachioradialis, patella and achilles.  Plantar responses are downgoing bilaterally. Gait and Station: The patient is able to ambulate without difficulty.The patient is able to heel toe walk without any difficulty.The patient is able to ambulate in a tandem fashion. The patient is able to stand in the Romberg position.     Thank you for allowing [place] the opportunity to participate in the care of this nice patient. Please do not hesitate to contact us for any  questions or concerns.   Total time spent on today's visit was *** minutes dedicated to this patient today, preparing to see patient, examining the patient, ordering tests and/or medications and counseling the patient, documenting clinical information in the EHR or other health record, independently interpreting results and communicating results to the patient/family, discussing treatment and goals, answering patient's questions and coordinating care.  Cc:  Nicoletta Dress, MD  Sharene Butters 05/18/2022 1:34 PM

## 2022-05-18 NOTE — Patient Instructions (Addendum)
It was a pleasure to see you today at our office.   Recommendations:  MRI of the brain, the radiology office will call you to arrange you appointment Check labs today Follow up in 1 month   Whom to call:  Memory  decline, memory medications: Call our office [phone]   For psychiatric meds, mood meds: Please have your primary care physician manage these medications.   Counseling regarding caregiver distress, including caregiver depression, anxiety and issues regarding community resources, adult day care programs, adult living facilities, or memory care questions:   Feel free to contact [place], Social Worker at [phone]   For assessment of decision of mental capacity and competency:  Call Dr. Anthoney Harada, geriatric psychiatrist at [phone]  For guidance in geriatric dementia issues please call [facility] [phone]  For guidance regarding [facility] and if placement were needed at the facility, contact Arnell Asal, Social Worker tel: [phone]  If you have any severe symptoms of a stroke, or other severe issues such as confusion,severe chills or fever, etc call 911 or go to the ER as you may need to be evaluated further   Feel free to visit Facebook page " [employer]" for tips of how to care for people with memory problems.   Feel free to go to the following database for funded clinical studies conducted around the world: http://saunders.com/   https://www.triadclinicaltrials.com/     RECOMMENDATIONS FOR ALL PATIENTS WITH MEMORY PROBLEMS: 1. Continue to exercise (Recommend 30 minutes of walking everyday, or 3 hours every week) 2. Increase social interactions - continue going to [place] and enjoy social gatherings with friends and family 3. Eat healthy, avoid fried foods and eat more fruits and vegetables 4. Maintain adequate blood pressure, blood sugar, and blood cholesterol level. Reducing the risk of stroke  and cardiovascular disease also helps promoting better memory. 5. Avoid stressful situations. Live a simple life and avoid aggravations. Organize your time and prepare for the next day in anticipation. 6. Sleep well, avoid any interruptions of sleep and avoid any distractions in the bedroom that may interfere with adequate sleep quality 7. Avoid sugar, avoid sweets as there is a strong link between excessive sugar intake, diabetes, and cognitive impairment We discussed the Mediterranean diet, which has been shown to help patients reduce the risk of progressive memory disorders and reduces cardiovascular risk. This includes eating fish, eat fruits and green leafy vegetables, nuts like almonds and hazelnuts, walnuts, and also use olive oil. Avoid fast foods and fried foods as much as possible. Avoid sweets and sugar as sugar use has been linked to worsening of memory function.  There is always a concern of gradual progression of memory problems. If this is the case, then we may need to adjust level of care according to patient needs. Support, both to the patient and caregiver, should then be put into place.      You have been referred for a neuropsychological evaluation (i.e., evaluation of memory and thinking abilities). Please bring someone with you to this appointment if possible, as it is helpful for the doctor to hear from both you and another adult who knows you well. Please bring eyeglasses and hearing aids if you wear them.    The evaluation will take approximately 3 hours and has two parts:   The first part is a clinical interview with the neuropsychologist (Dr. Melvyn Novas or Dr. Nicole Kindred). During the interview, the neuropsychologist will speak with you and the individual  you brought to the appointment.    The second part of the evaluation is testing with the doctor's technician Hinton Dyer or Maudie Mercury). During the testing, the technician will ask you to remember different types of material, solve problems,  and answer some questionnaires. Your family member will not be present for this portion of the evaluation.   Please note: We must reserve several hours of the neuropsychologist's time and the psychometrician's time for your evaluation appointment. As such, there is a No-Show fee of $100. If you are unable to attend any of your appointments, please contact our office as soon as possible to reschedule.    FALL PRECAUTIONS: Be cautious when walking. Scan the area for obstacles that may increase the risk of trips and falls. When getting up in the mornings, sit up at the edge of the bed for a few minutes before getting out of bed. Consider elevating the bed at the head end to avoid drop of blood pressure when getting up. Walk always in a well-lit room (use night lights in the walls). Avoid area rugs or power cords from appliances in the middle of the walkways. Use a walker or a cane if necessary and consider physical therapy for balance exercise. Get your eyesight checked regularly.  FINANCIAL OVERSIGHT: Supervision, especially oversight when making financial decisions or transactions is also recommended.  HOME SAFETY: Consider the safety of the kitchen when operating appliances like stoves, microwave oven, and blender. Consider having supervision and share cooking responsibilities until no longer able to participate in those. Accidents with firearms and other hazards in the house should be identified and addressed as well.   ABILITY TO BE LEFT ALONE: If patient is unable to contact 911 operator, consider using LifeLine, or when the need is there, arrange for someone to stay with patients. Smoking is a fire hazard, consider supervision or cessation. Risk of wandering should be assessed by caregiver and if detected at any point, supervision and safe proof recommendations should be instituted.  MEDICATION SUPERVISION: Inability to self-administer medication needs to be constantly addressed. Implement a  mechanism to ensure safe administration of the medications.   DRIVING: Regarding driving, in patients with progressive memory problems, driving will be impaired. We advise to have someone else do the driving if trouble finding directions or if minor accidents are reported. Independent driving assessment is available to determine safety of driving.   If you are interested in the driving assessment, you can contact the following:  [employer] in [place]  Iselin [place] [phone] or [phone]    [place] refers to food and lifestyle choices that are based on the traditions of countries located on the [employer]. This way of eating has been shown to help prevent certain conditions and improve outcomes for people who have chronic diseases, like kidney disease and heart disease. What are tips for following this plan? Lifestyle  Cook and eat meals together with your family, when possible. Drink enough fluid to keep your urine clear or pale yellow. Be physically active every day. This includes: Aerobic exercise like running or swimming. Leisure activities like gardening, walking, or housework. Get 7-8 hours of sleep each night. If recommended by your health care provider, drink red wine in moderation. This means 1 glass a day for nonpregnant women and 2 glasses a day for men. A glass of wine equals 5 oz (150 mL). Reading food labels  Check the serving size of packaged foods. For foods such as rice and pasta, the serving size refers to the amount of cooked product, not dry. Check the total fat in packaged foods. Avoid foods that have saturated fat or trans fats. Check the ingredients list for added sugars, such as corn syrup. Shopping  At the grocery store, buy most of your food from the areas near the walls of the store. This  includes: Fresh fruits and vegetables (produce). Grains, beans, nuts, and seeds. Some of these may be available in unpackaged forms or large amounts (in bulk). Fresh seafood. Poultry and eggs. Low-fat dairy products. Buy whole ingredients instead of prepackaged foods. Buy fresh fruits and vegetables in-season from local farmers markets. Buy frozen fruits and vegetables in resealable bags. If you do not have access to quality fresh seafood, buy precooked frozen shrimp or canned fish, such as tuna, salmon, or sardines. Buy small amounts of raw or cooked vegetables, salads, or olives from the deli or salad bar at your store. Stock your pantry so you always have certain foods on hand, such as olive oil, canned tuna, canned tomatoes, rice, pasta, and beans. Cooking  Cook foods with extra-virgin olive oil instead of using butter or other vegetable oils. Have meat as a side dish, and have vegetables or grains as your main dish. This means having meat in small portions or adding small amounts of meat to foods like pasta or stew. Use beans or vegetables instead of meat in common dishes like chili or lasagna. Experiment with different cooking methods. Try roasting or broiling vegetables instead of steaming or sauteing them. Add frozen vegetables to soups, stews, pasta, or rice. Add nuts or seeds for added healthy fat at each meal. You can add these to yogurt, salads, or vegetable dishes. Marinate fish or vegetables using olive oil, lemon juice, garlic, and fresh herbs. Meal planning  Plan to eat 1 vegetarian meal one day each week. Try to work up to 2 vegetarian meals, if possible. Eat seafood 2 or more times a week. Have healthy snacks readily available, such as: Vegetable sticks with hummus. Greek yogurt. Fruit and nut trail mix. Eat balanced meals throughout the week. This includes: Fruit: 2-3 servings a day Vegetables: 4-5 servings a day Low-fat dairy: 2 servings a day Fish, poultry, or  lean meat: 1 serving a day Beans and legumes: 2 or more servings a week Nuts and seeds: 1-2 servings a day Whole grains: 6-8 servings a day Extra-virgin olive oil: 3-4 servings a day Limit red meat and sweets to only a few servings a month What are my food choices? Mediterranean diet Recommended Grains: Whole-grain pasta. Brown rice. Bulgar wheat. Polenta. Couscous. Whole-wheat bread. [place]. Vegetables: Artichokes. Beets. Broccoli. Cabbage. Carrots. Eggplant. Green beans. Chard. Kale. Spinach. Onions. Leeks. Peas. Squash. Tomatoes. Peppers. Radishes. Fruits: Apples. Apricots. Avocado. Berries. Bananas. Cherries. Dates. Figs. Grapes. Lemons. Melon. Oranges. Peaches. Plums. Pomegranate. Meats and other protein foods: Beans. Almonds. Sunflower seeds. Pine nuts. Peanuts. [place]. Salmon. Scallops. Shrimp. [place]. Tilapia. Clams. Oysters. Eggs. Dairy: Low-fat milk. Cheese. Greek yogurt. Beverages: Water. Red wine. Herbal tea. Fats and oils: Extra virgin olive oil. Avocado oil. Grape seed oil. Sweets and desserts: [place] yogurt with honey. Baked apples. Poached pears. Trail mix. Seasoning and other foods: Basil. Cilantro. Coriander. Cumin. Mint. Parsley. Sage. Rosemary. Tarragon. Garlic. Oregano. Thyme. Pepper. Balsalmic vinegar. Tahini. Hummus. Tomato sauce. Olives. Mushrooms. Limit these Grains: Prepackaged pasta or rice dishes. Prepackaged cereal with added sugar. Vegetables: Deep  fried potatoes (french fries). Fruits: Fruit canned in syrup. Meats and other protein foods: Beef. Pork. Lamb. Poultry with skin. Hot dogs. Berniece Salines. Dairy: Ice cream. Sour cream. Whole milk. Beverages: Juice. Sugar-sweetened soft drinks. Beer. Liquor and spirits. Fats and oils: Butter. Canola oil. Vegetable oil. Beef fat (tallow). Lard. Sweets and desserts: Cookies. Cakes. Pies. Candy. Seasoning and other foods: Mayonnaise. Premade sauces and marinades. The items listed may not be a complete list. Talk with your  dietitian about what dietary choices are right for you. Summary The Mediterranean diet includes both food and lifestyle choices. Eat a variety of fresh fruits and vegetables, beans, nuts, seeds, and whole grains. Limit the amount of red meat and sweets that you eat. Talk with your health care provider about whether it is safe for you to drink red wine in moderation. This means 1 glass a day for nonpregnant women and 2 glasses a day for men. A glass of wine equals 5 oz (150 mL). This information is not intended to replace advice given to you by your health care provider. Make sure you discuss any questions you have with your health care provider. Document Released: 05/13/2016 Document Revised: 06/15/2016 Document Reviewed: 05/13/2016 Elsevier Interactive Patient Education  2017 [employer].    We have sent a referral to [facility] for your MRI and they will call you directly to schedule your appointment. They are located at [place]. If you need to contact them directly please call [phone].   Your provider has requested that you have labwork completed today. Please go to [facility] ([id number]) on the second floor of this building before leaving the office today. You do not need to check in. If you are not called within 15 minutes please check with the front desk.

## 2022-05-19 ENCOUNTER — Telehealth: Payer: Self-pay | Admitting: Physician Assistant

## 2022-05-19 LAB — TSH: TSH: 2.39 u[IU]/mL (ref 0.35–5.50)

## 2022-05-19 LAB — VITAMIN B12: Vitamin B-12: 303 pg/mL (ref 211–911)

## 2022-05-19 NOTE — Telephone Encounter (Signed)
[name], patient's daughter, called and wanted to confirm whether patient is needing to take a B-12 supplement.  She said the patient had a B-12 injection from her PCP on 05/06/22.

## 2022-05-19 NOTE — Progress Notes (Signed)
Vitamin B12 is on the lower side, with maintaining with B12 daily, 1000 mcg tab.  The thyroid levels are normal.  Thank you

## 2022-05-20 DIAGNOSIS — G3184 Mild cognitive impairment, so stated: Secondary | ICD-10-CM | POA: Insufficient documentation

## 2022-06-01 ENCOUNTER — Ambulatory Visit
Admission: RE | Admit: 2022-06-01 | Discharge: 2022-06-01 | Disposition: A | Discharge status: Home / Self Care | Payer: Medicare Other | Source: Ambulatory Visit | Attending: Physician Assistant | Admitting: Physician Assistant

## 2022-06-01 DIAGNOSIS — R413 Other amnesia: Secondary | ICD-10-CM | POA: Diagnosis not present

## 2022-06-01 DIAGNOSIS — I6782 Cerebral ischemia: Secondary | ICD-10-CM | POA: Diagnosis not present

## 2022-06-02 NOTE — Progress Notes (Signed)
MRI brain shows age related changes, no new findings are seen when compared to the MRI from 2021, thanks

## 2022-06-23 ENCOUNTER — Ambulatory Visit: Payer: Medicare Other | Admitting: Physician Assistant

## 2022-06-23 ENCOUNTER — Encounter: Payer: Self-pay | Admitting: Physician Assistant

## 2022-06-23 VITALS — BP 141/83 | HR 76 | Resp 20 | Ht 65.0 in | Wt 223.0 lb

## 2022-06-23 DIAGNOSIS — G3184 Mild cognitive impairment, so stated: Secondary | ICD-10-CM

## 2022-06-23 MED ORDER — DONEPEZIL HCL 5 MG PO TABS: ORAL_TABLET | ORAL | 11 refills | Status: DC

## 2022-06-23 NOTE — Progress Notes (Signed)
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 RH female with  a history of hypertension, hyperlipidemia, anxiety, depression seen today in follow up to discuss the  06/01/22 MRI results. These were personally reviewed, remarkable for within normal parenchymal volume for age with no disproportionate hippocampal atrophy, and mild-to-moderate chronic small vessel ischemic changes not significantly changed since 2021. Findings are suspicious for mild cognitive impairment, likely with a vascular component, however, there is also an ingredient of significant stress at home as she is the only24/7  caregiver to her husband who is also ill which causes her significant insomnia and as a consequence, increased sleepiness during the day, lack of stimulating activities and emotional overeating.  etc. She is not on antidementia medications   Recommendations   Follow up in 6  months. Recommend good control of cardiovascular risk factors.  Recommend baby aspirin daily  Recommend B12 supplements daily, follow with PCP  Recommend psychotherapy for situational depression and stress  Start donepezil 5 mg daily.Side effects discussed  Recommend appt with Levonne Lapping, Social Worker, to discuss dementia counseling, including caregiver distress for husband, community resources, etc      Subjective:    This patient is accompanied in the office by her sister  who supplements the history.  Previous records as well as any outside records available were reviewed prior to todays visit. MoCA 05/2022 was 21/30    Any changes in memory since last visit? Patient denies any changes from prior      Initial visit 05/18/22 How long did patient have memory difficulties?  She reports having memory difficulties for the last year, "when my health took a turn for the worse ".  She states that her short-term memory is worse,  she cannot remember people conversations, or remember the names of people that she actually knows. She gets frustrated easily, and loses the train of thought.  Otherwise, her long-term memory is not as affected.  She reports significant amount of stress, caring for her husband who has worsening dementia. Patient lives with: with spouse.  She reports that she would really like to consider sending her husband to an adult living facility, but her daughter who lives in [place], who is the healthcare power of attorney, would never allow that.  She reports that her level of stress is "tremendous ".  "He follows me everywhere I go and I had to take it whenever I go ". repeats oneself? Denies .  She called her daughter 3 times this morning and wanted to know why she was going to the appointment today. Disoriented when walking into a room?  Patient denies   Leaving objects in unusual places?  Patient denies   Ambulates  with difficulty?   Uses L cane for balance.  "She has fallen down the handicap ramp built onto the front porch recently.  Both her and her husband are unsteady with walking and needs a cane to need to hold onto the wall or another person.  "Her daughter reports that she has difficulty getting in and out of her car and walking to and from the house. Recent falls?  Patient denies   Any head injuries?  Patient denies   History of seizures?   Patient denies   Wandering behavior?  Patient denies   Patient drives?   Denies, although she has issues parallel parking.  Any mood changes such irritability  agitation?  She is somewhat more irritable, but she attributes this to her level of stress at home caring for her husband.  According to her daughter, the patient has been showing lack of empathy for her spouse's condition.  She attempts to cancel her medical appointments, stating that "she does not feel well and does not want to go today ". Any history of depression?:  She reports that since her daughter  left 5 years ago, to leaving [place], she has been more depressed, because she cannot see her that often. Hallucinations?  Patient denies   Paranoia?  Patient denies   Patient reports that she sometimes has days and night mixed up, especially during the last year.  She denies any vivid dreams, REM behavior or sleepwalking.   History of sleep apnea?  Patient denies   Any hygiene concerns?  Patient denies but her sister reports that she is more disheveled than before, she does not pay attention to her personal grooming as she did in prior years.  Her daughter reports that her house is not clean, she has hoarder type of behaviors.  She refuses to get rid of anything or receive assistance with cleaning her home or cooking.  She gets Meals on Wheels service in [place], but does not usually eat her frozen meals according to daughter. Independent of bathing and dressing?  Endorsed  Does the patient needs help with medications? Denies.  Her brother-in-law makes a tray because according to him, she has been missing at least 3 doses of medicine weekly for herself and for her spouse.  "She does not seem to care if she takes her medication or not " Who is in charge of the finances?  Daughter in control.  Any changes in appetite?  Patient denies   Patient have trouble swallowing? Patient denies   Does the patient cook?  Patient denies .  Sometimes he opens a can of food item to split with spouse or has him who can jam an egg. Any kitchen accidents such as leaving the stove on? Patient denies   Any headaches?  Patient denies   Double vision? Patient denies   Any focal numbness or tingling?  Patient denies   Chronic back pain Patient denies .  He does attend a pain clinic for DDD and other musculoskeletal issues. Unilateral weakness?  Patient denies   Any tremors?  Patient denies   Any history of anosmia?  Patient denies   Any incontinence of urine?  Patient denies   Any bowel dysfunction?   Patient  denies   History of heavy alcohol intake?  Patient denies   History of heavy tobacco use?  Patient denies   Family history of dementia?  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  She has been exposed to radium and sulfur for many years.   MRI brain 06/01/22  Parenchymal volume is within expected limits for age with no disproportionate hippocampal atrophy.2. Mild-to-moderate chronic small vessel ischemic changes not significantly changed since 2021. 3. No acute finding.    Labs 05/2022 TSH 2.39, B12 303.  CURRENT MEDICATIONS:  Outpatient Encounter Medications as of 06/23/2022  Medication Sig   albuterol (PROVENTIL HFA;VENTOLIN HFA) 108 (90 Base) MCG/ACT inhaler Inhale 1-2 puffs into the lungs every 6 (six) hours as needed for wheezing or shortness of breath.   alendronate (FOSAMAX) 70 MG tablet Take 70 mg by mouth once a week.   aspirin EC  81 MG tablet Take 81 mg by mouth at bedtime.  (Patient not taking: Reported on 05/18/2022)   benazepril-hydrochlorthiazide (LOTENSIN HCT) 20-25 MG tablet Take 1 tablet by mouth daily.   celecoxib (CELEBREX) 200 MG capsule Take 200 mg by mouth daily.   cyclobenzaprine (FLEXERIL) 10 MG tablet Take 10 mg by mouth daily.    Dulaglutide (TRULICITY) 4.68 EH/2.1YY SOPN Inject 0.75 mg into the skin once a week.   ergocalciferol (VITAMIN D2) 1.25 MG (50000 UT) capsule Take 50,000 Units by mouth once a week.   escitalopram (LEXAPRO) 10 MG tablet Take 10 mg by mouth daily.   ketoconazole (NIZORAL) 2 % cream Apply 1 application topically 2 (two) times daily.   metFORMIN (GLUCOPHAGE-XR) 500 MG 24 hr tablet Take 500 mg by mouth 2 (two) times daily.    mometasone-formoterol (DULERA) 200-5 MCG/ACT AERO Inhale 2 puffs into the lungs 2 (two) times daily.   omeprazole (PRILOSEC) 40 MG capsule Take 40 mg by mouth daily.   rosuvastatin (CRESTOR) 10 MG tablet Take 10 mg by mouth every evening.   spironolactone (ALDACTONE) 25 MG tablet  Take 25 mg by mouth daily.   traMADol (ULTRAM) 50 MG tablet Take 50 mg by mouth every 8 (eight) hours as needed for pain.   No facility-administered encounter medications on file as of 06/23/2022.        No data to display            05/20/2022    9:00 AM  Montreal Cognitive Assessment   Visuospatial/ Executive (0/5) 4  Naming (0/3) 2  Attention: Read list of digits (0/2) 2  Attention: Read list of letters (0/1) 1  Attention: Serial 7 subtraction starting at 100 (0/3) 3  Language: Repeat phrase (0/2) 2  Language : Fluency (0/1) 0  Abstraction (0/2) 1  Delayed Recall (0/5) 1  Orientation (0/6) 5  Total 21  Adjusted Score (based on education) 21   Thank you for allowing [place] the opportunity to participate in the care of this nice patient. Please do not hesitate to contact us for any questions or concerns.   Total time spent on today's visit was 44 minutes dedicated to this patient today, preparing to see patient, examining the patient, ordering tests and/or medications and counseling the patient, documenting clinical information in the EHR or other health record, independently interpreting results and communicating results to the patient/family, discussing treatment and goals, answering patient's questions and coordinating care.  Cc:  Nicoletta Dress, MD  Sharene Butters 06/23/2022 7:24 AM

## 2022-06-23 NOTE — Patient Instructions (Addendum)
It was a pleasure to see you today at our office.   Recommendations:  Continue B12 supplements daily Start Donepezil '5mg'$  daily. Side effects discussed   Follow up in 6 months  Consider psychotherapy   Whom to call:  Memory  decline, memory medications: Call our office [phone]   For psychiatric meds, mood meds: Please have your primary care physician manage these medications.   Counseling regarding caregiver distress, including caregiver depression, anxiety and issues regarding community resources, adult day care programs, adult living facilities, or memory care questions:   Feel free to make an appt  Misty Remer Macho, Education officer, museum.   For assessment of decision of mental capacity and competency:  Call Dr. Anthoney Harada, geriatric psychiatrist at [phone]  For guidance in geriatric dementia issues please call [facility] [phone]  For guidance regarding [facility] and if placement were needed at the facility, contact Arnell Asal, Social Worker tel: [phone]  If you have any severe symptoms of a stroke, or other severe issues such as confusion,severe chills or fever, etc call 911 or go to the ER as you may need to be evaluated further   Feel free to visit Facebook page " [employer]" for tips of how to care for people with memory problems.   Feel free to go to the following database for funded clinical studies conducted around the world: http://saunders.com/   https://www.triadclinicaltrials.com/     RECOMMENDATIONS FOR ALL PATIENTS WITH MEMORY PROBLEMS: 1. Continue to exercise (Recommend 30 minutes of walking everyday, or 3 hours every week) 2. Increase social interactions - continue going to [place] and enjoy social gatherings with friends and family 3. Eat healthy, avoid fried foods and eat more fruits and vegetables 4. Maintain adequate blood pressure, blood sugar, and blood cholesterol level. Reducing the risk of  stroke and cardiovascular disease also helps promoting better memory. 5. Avoid stressful situations. Live a simple life and avoid aggravations. Organize your time and prepare for the next day in anticipation. 6. Sleep well, avoid any interruptions of sleep and avoid any distractions in the bedroom that may interfere with adequate sleep quality 7. Avoid sugar, avoid sweets as there is a strong link between excessive sugar intake, diabetes, and cognitive impairment We discussed the Mediterranean diet, which has been shown to help patients reduce the risk of progressive memory disorders and reduces cardiovascular risk. This includes eating fish, eat fruits and green leafy vegetables, nuts like almonds and hazelnuts, walnuts, and also use olive oil. Avoid fast foods and fried foods as much as possible. Avoid sweets and sugar as sugar use has been linked to worsening of memory function.  There is always a concern of gradual progression of memory problems. If this is the case, then we may need to adjust level of care according to patient needs. Support, both to the patient and caregiver, should then be put into place.      You have been referred for a neuropsychological evaluation (i.e., evaluation of memory and thinking abilities). Please bring someone with you to this appointment if possible, as it is helpful for the doctor to hear from both you and another adult who knows you well. Please bring eyeglasses and hearing aids if you wear them.    The evaluation will take approximately 3 hours and has two parts:   The first part is a clinical interview with the neuropsychologist (Dr. Melvyn Novas or Dr. Nicole Kindred). During the interview, the neuropsychologist will speak with you and the individual  you brought to the appointment.    The second part of the evaluation is testing with the doctor's technician Hinton Dyer or Maudie Mercury). During the testing, the technician will ask you to remember different types of material, solve  problems, and answer some questionnaires. Your family member will not be present for this portion of the evaluation.   Please note: We must reserve several hours of the neuropsychologist's time and the psychometrician's time for your evaluation appointment. As such, there is a No-Show fee of $100. If you are unable to attend any of your appointments, please contact our office as soon as possible to reschedule.    FALL PRECAUTIONS: Be cautious when walking. Scan the area for obstacles that may increase the risk of trips and falls. When getting up in the mornings, sit up at the edge of the bed for a few minutes before getting out of bed. Consider elevating the bed at the head end to avoid drop of blood pressure when getting up. Walk always in a well-lit room (use night lights in the walls). Avoid area rugs or power cords from appliances in the middle of the walkways. Use a walker or a cane if necessary and consider physical therapy for balance exercise. Get your eyesight checked regularly.  FINANCIAL OVERSIGHT: Supervision, especially oversight when making financial decisions or transactions is also recommended.  HOME SAFETY: Consider the safety of the kitchen when operating appliances like stoves, microwave oven, and blender. Consider having supervision and share cooking responsibilities until no longer able to participate in those. Accidents with firearms and other hazards in the house should be identified and addressed as well.   ABILITY TO BE LEFT ALONE: If patient is unable to contact 911 operator, consider using LifeLine, or when the need is there, arrange for someone to stay with patients. Smoking is a fire hazard, consider supervision or cessation. Risk of wandering should be assessed by caregiver and if detected at any point, supervision and safe proof recommendations should be instituted.  MEDICATION SUPERVISION: Inability to self-administer medication needs to be constantly addressed.  Implement a mechanism to ensure safe administration of the medications.   DRIVING: Regarding driving, in patients with progressive memory problems, driving will be impaired. We advise to have someone else do the driving if trouble finding directions or if minor accidents are reported. Independent driving assessment is available to determine safety of driving.   If you are interested in the driving assessment, you can contact the following:  [employer] in [place]  Gisela Milford [phone] or [phone]    [place] refers to food and lifestyle choices that are based on the traditions of countries located on the [employer]. This way of eating has been shown to help prevent certain conditions and improve outcomes for people who have chronic diseases, like kidney disease and heart disease. What are tips for following this plan? Lifestyle  Cook and eat meals together with your family, when possible. Drink enough fluid to keep your urine clear or pale yellow. Be physically active every day. This includes: Aerobic exercise like running or swimming. Leisure activities like gardening, walking, or housework. Get 7-8 hours of sleep each night. If recommended by your health care provider, drink red wine in moderation. This means 1 glass a day for nonpregnant women and 2 glasses a day for men. A glass of wine equals 5 oz (150 mL). Reading food labels  Check the serving size of packaged foods. For foods such as rice and pasta, the serving size refers to the amount of cooked product, not dry. Check the total fat in packaged foods. Avoid foods that have saturated fat or trans fats. Check the ingredients list for added sugars, such as corn syrup. Shopping  At the grocery store, buy most of your food from the areas near the walls of the store. This  includes: Fresh fruits and vegetables (produce). Grains, beans, nuts, and seeds. Some of these may be available in unpackaged forms or large amounts (in bulk). Fresh seafood. Poultry and eggs. Low-fat dairy products. Buy whole ingredients instead of prepackaged foods. Buy fresh fruits and vegetables in-season from local farmers markets. Buy frozen fruits and vegetables in resealable bags. If you do not have access to quality fresh seafood, buy precooked frozen shrimp or canned fish, such as tuna, salmon, or sardines. Buy small amounts of raw or cooked vegetables, salads, or olives from the deli or salad bar at your store. Stock your pantry so you always have certain foods on hand, such as olive oil, canned tuna, canned tomatoes, rice, pasta, and beans. Cooking  Cook foods with extra-virgin olive oil instead of using butter or other vegetable oils. Have meat as a side dish, and have vegetables or grains as your main dish. This means having meat in small portions or adding small amounts of meat to foods like pasta or stew. Use beans or vegetables instead of meat in common dishes like chili or lasagna. Experiment with different cooking methods. Try roasting or broiling vegetables instead of steaming or sauteing them. Add frozen vegetables to soups, stews, pasta, or rice. Add nuts or seeds for added healthy fat at each meal. You can add these to yogurt, salads, or vegetable dishes. Marinate fish or vegetables using olive oil, lemon juice, garlic, and fresh herbs. Meal planning  Plan to eat 1 vegetarian meal one day each week. Try to work up to 2 vegetarian meals, if possible. Eat seafood 2 or more times a week. Have healthy snacks readily available, such as: Vegetable sticks with hummus. Greek yogurt. Fruit and nut trail mix. Eat balanced meals throughout the week. This includes: Fruit: 2-3 servings a day Vegetables: 4-5 servings a day Low-fat dairy: 2 servings a day Fish, poultry, or  lean meat: 1 serving a day Beans and legumes: 2 or more servings a week Nuts and seeds: 1-2 servings a day Whole grains: 6-8 servings a day Extra-virgin olive oil: 3-4 servings a day Limit red meat and sweets to only a few servings a month What are my food choices? Mediterranean diet Recommended Grains: Whole-grain pasta. Brown rice. Bulgar wheat. Polenta. Couscous. Whole-wheat bread. [place]. Vegetables: Artichokes. Beets. Broccoli. Cabbage. Carrots. Eggplant. Green beans. Chard. Kale. Spinach. Onions. Leeks. Peas. Squash. Tomatoes. Peppers. Radishes. Fruits: Apples. Apricots. Avocado. Berries. Bananas. Cherries. Dates. Figs. Grapes. Lemons. Melon. Oranges. Peaches. Plums. Pomegranate. Meats and other protein foods: Beans. Almonds. Sunflower seeds. Pine nuts. Peanuts. [place]. Salmon. Scallops. Shrimp. [place]. Tilapia. Clams. Oysters. Eggs. Dairy: Low-fat milk. Cheese. Greek yogurt. Beverages: Water. Red wine. Herbal tea. Fats and oils: Extra virgin olive oil. Avocado oil. Grape seed oil. Sweets and desserts: [place] yogurt with honey. Baked apples. Poached pears. Trail mix. Seasoning and other foods: Basil. Cilantro. Coriander. Cumin. Mint. Parsley. Sage. Rosemary. Tarragon. Garlic. Oregano. Thyme. Pepper. Balsalmic vinegar. Tahini. Hummus. Tomato sauce. Olives. Mushrooms. Limit these Grains: Prepackaged pasta or rice dishes. Prepackaged cereal with added sugar. Vegetables: Deep  fried potatoes (french fries). Fruits: Fruit canned in syrup. Meats and other protein foods: Beef. Pork. Lamb. Poultry with skin. Hot dogs. Berniece Salines. Dairy: Ice cream. Sour cream. Whole milk. Beverages: Juice. Sugar-sweetened soft drinks. Beer. Liquor and spirits. Fats and oils: Butter. Canola oil. Vegetable oil. Beef fat (tallow). Lard. Sweets and desserts: Cookies. Cakes. Pies. Candy. Seasoning and other foods: Mayonnaise. Premade sauces and marinades. The items listed may not be a complete list. Talk with your  dietitian about what dietary choices are right for you. Summary The Mediterranean diet includes both food and lifestyle choices. Eat a variety of fresh fruits and vegetables, beans, nuts, seeds, and whole grains. Limit the amount of red meat and sweets that you eat. Talk with your health care provider about whether it is safe for you to drink red wine in moderation. This means 1 glass a day for nonpregnant women and 2 glasses a day for men. A glass of wine equals 5 oz (150 mL). This information is not intended to replace advice given to you by your health care provider. Make sure you discuss any questions you have with your health care provider. Document Released: 05/13/2016 Document Revised: 06/15/2016 Document Reviewed: 05/13/2016 Elsevier Interactive Patient Education  2017 [employer].    We have sent a referral to [facility] for your MRI and they will call you directly to schedule your appointment. They are located at [place]. If you need to contact them directly please call [phone].   Your provider has requested that you have labwork completed today. Please go to [facility] ([id number]) on the second floor of this building before leaving the office today. You do not need to check in. If you are not called within 15 minutes please check with the front desk.

## 2022-07-01 ENCOUNTER — Encounter: Payer: Self-pay | Admitting: Physician Assistant

## 2022-07-01 ENCOUNTER — Ambulatory Visit: Payer: Medicare Other | Admitting: Licensed Clinical Social Worker

## 2022-07-01 ENCOUNTER — Other Ambulatory Visit: Payer: Self-pay

## 2022-07-01 DIAGNOSIS — F419 Anxiety disorder, unspecified: Secondary | ICD-10-CM

## 2022-07-01 DIAGNOSIS — F32A Depression, unspecified: Secondary | ICD-10-CM

## 2022-07-01 DIAGNOSIS — G3184 Mild cognitive impairment, so stated: Secondary | ICD-10-CM

## 2022-07-01 DIAGNOSIS — Z6379 Other stressful life events affecting family and household: Secondary | ICD-10-CM | POA: Diagnosis not present

## 2022-07-01 NOTE — BH Specialist Note (Signed)
Integrated Behavioral Health Initial In-Person Visit  MRN: [id number] Name: Kristin Flores [facility]  Number of [place] Clinician visits: 1- Initial Visit  Session Start time: 6503    Session End time: 1340  Total time in minutes: 55   Types of Service: Individual psychotherapy  Interpretor:No. Interpretor Name and Language: NA   Warm Hand Off Completed.        Subjective: Kristin Flores is a 76 y.o. female accompanied by Sibling Patient was referred by Sharene Butters for Caregiver Burden. Patient reports the following symptoms/concerns: feelings being overwhelmed , decline in her own health,  feeling of despair  Duration of problem: Several Months; Severity of problem: moderate  Objective: Mood: Depressed and Affect: Appropriate Risk of harm to self or others: No plan to harm self or others  Life Context: Family and Social: Pt lives with husband who has significant memory impairment and is the primary caregiver .  Pt has sister that lives in the area and dtr that lives in [place] School/Work: Pt is retired  Self-Care: Pt is able to completed ADL's but does use a cane and takes care of her husband  Life Changes: Pt is the primary caregiver of her husband who has memory issue and has health issues herself.   Patient and/or Family's Strengths/Protective Factors: Concrete supports in place (healthy food, safe environments, etc.)  Goals Addressed: Patient will: Reduce symptoms of: depression and stress Increase knowledge and/or ability of: coping skills, self-management skills, and stress reduction  Demonstrate ability to: Increase healthy adjustment to current life circumstances and Increase adequate support systems for patient/family  Progress towards Goals: Ongoing  Interventions: Interventions utilized: Supportive Counseling, Psychoeducation and/or Health Education, and Link to [employer]  Standardized Assessments completed: Not  Needed  Patient and/or Family Response: pt is open to maximizing the use of formal and informal support systems and structures ,  To utilize support groups and adult day centers.  LCSW provided education on the importance of caring for the caregiver .    Patient Centered Plan: Patient is on the following Treatment Plan(s):  To link up with community options provided of adult day center, respite options, look at priorities her needs and ways of reducing stress for herself.  Look at psychotherapy to explore feelings of grief, loss and other feelings .    Assessment: Patient currently experiencing eelings being overwhelmed , decline in her own health,  feeling of despair due to being the primary caregiver for her husband who has significant memory impairment .   Patient may benefit from To link up with community options provided of adult day center, respite options, look at priorities her needs and ways of reducing stress for herself.  Look at psychotherapy to explore feelings of grief, loss and other feelings . Marland Kitchen  Plan: Follow up with behavioral health clinician on : As needed  Behavioral recommendations: Adult day center, Respite options, Elder Law, Support group, Therapy options, and other resources provided  Referral(s):  Listed above  "From scale of 1-10, how likely are you to follow plan?": 10  Demoni Parmar A Taylor-Paladino, LCSW

## 2022-07-22 DIAGNOSIS — M4306 Spondylolysis, lumbar region: Secondary | ICD-10-CM | POA: Diagnosis not present

## 2022-07-22 DIAGNOSIS — R413 Other amnesia: Secondary | ICD-10-CM | POA: Diagnosis not present

## 2022-07-22 DIAGNOSIS — Z5181 Encounter for therapeutic drug level monitoring: Secondary | ICD-10-CM | POA: Diagnosis not present

## 2022-07-22 DIAGNOSIS — M5136 Other intervertebral disc degeneration, lumbar region: Secondary | ICD-10-CM | POA: Diagnosis not present

## 2022-07-23 ENCOUNTER — Ambulatory Visit: Payer: Medicare Other | Admitting: Physician Assistant

## 2022-07-27 DIAGNOSIS — I10 Essential (primary) hypertension: Secondary | ICD-10-CM | POA: Diagnosis not present

## 2022-07-27 DIAGNOSIS — E559 Vitamin D deficiency, unspecified: Secondary | ICD-10-CM | POA: Diagnosis not present

## 2022-07-27 DIAGNOSIS — E785 Hyperlipidemia, unspecified: Secondary | ICD-10-CM | POA: Diagnosis not present

## 2022-07-27 DIAGNOSIS — J45909 Unspecified asthma, uncomplicated: Secondary | ICD-10-CM | POA: Diagnosis not present

## 2022-07-27 DIAGNOSIS — E1140 Type 2 diabetes mellitus with diabetic neuropathy, unspecified: Secondary | ICD-10-CM | POA: Diagnosis not present

## 2022-09-10 DIAGNOSIS — J0100 Acute maxillary sinusitis, unspecified: Secondary | ICD-10-CM | POA: Diagnosis not present

## 2022-09-14 DIAGNOSIS — L304 Erythema intertrigo: Secondary | ICD-10-CM | POA: Diagnosis not present

## 2022-10-12 ENCOUNTER — Telehealth: Payer: Self-pay | Admitting: Anesthesiology

## 2022-10-12 NOTE — Telephone Encounter (Signed)
Pt's daughter Kristin Flores called stating her mother's memory and behavior have gotten much worse, she is not making good judgement calls. Kristin Flores would like to bring her mother in to see Clarise Cruz sooner this month if possible on Jan/25/24 the same day she is bringing her dad for an appt with Clarise Cruz as well.

## 2022-10-28 ENCOUNTER — Telehealth: Payer: Self-pay | Admitting: Physician Assistant

## 2022-10-28 ENCOUNTER — Ambulatory Visit: Payer: Medicare Other | Admitting: Physician Assistant

## 2022-10-28 NOTE — Telephone Encounter (Signed)
Pt's daughter called in stating the patient was stating she was very ill and couldn't come to her appointment today. The daughter called the police department to go check on them. When the police got there it seemed to just be her mental health that was an issue. The pt did not know what year it was.

## 2022-10-28 NOTE — Progress Notes (Incomplete)
Assessment/Plan:   Memory Impairment   Kristin Flores is a very pleasant 77 y.o. RH female  with  a history of hypertension, hyperlipidemia, anxiety, depression presenting today in follow-up for evaluation of memory loss. Patient ion donepezil 5 mg daily.  Personally reviewed MRI of the brain 06/01/2022 was remarkable for normal parenchymal volume for age, with no disproportionate hippocampal atrophy, and mild to moderate chronic small vessel ischemic changes, not significantly changed since 2021.  Since her last visit, the patient has been referred to psychotherapy.***  Personally reviewed of the brain ***   Recommendations:   Follow up in   months. Recommend good control of cardiovascular risk factors Continue B12 supplements She has had a referral for psychotherapy for situational depression and stress*** Continue donepezil 5 mg daily    Subjective:   This patient is accompanied in the office by ***  who supplements the history. Previous records as well as any outside records available were reviewed prior to todays visit.   Last MoCA on 05/23/2022, when she was last seen, was 21/30.     Any changes in memory since last visit? repeats oneself?  Endorsed Disoriented when walking into a room?  Patient denies except occasionally not remembering what patient came to the room for ***  Leaving objects in unusual places?  Patient denies   Wandering behavior?   denies   Any personality changes since last visit?   denies   Any worsening depression?: denies   Hallucinations or paranoia?  denies   Seizures?   denies    Any sleep changes?  Denies  vivid dreams, REM behavior or sleepwalking   Sleep apnea?   denies   Any hygiene concerns?   denies   Independent of bathing and dressing?  Endorsed  Does the patient needs help with medications? is in charge *** Who is in charge of the finances?   is in charge   *** Any changes in appetite?  denies ***   Patient have trouble  swallowing?  denies   Does the patient cook?  Any kitchen accidents such as leaving the stove on?   denies   Any headaches?    denies   Vision changes? denies Chronic back pain  denies   Ambulates with difficulty?    denies   Recent falls or head injuries?    denies     Unilateral weakness, numbness or tingling?   denies   Any tremors?  denies   Any anosmia?    denies   Any incontinence of urine?  denies   Any bowel dysfunction?  denies      Patient lives  *** Does the patient drive?***    Initial visit 05/18/22 How long did patient have memory difficulties?  She reports having memory difficulties for the last year, "when my health took a turn for the worse ".  She states that her short-term memory is worse, she cannot remember people conversations, or remember the names of people that she actually knows. She gets frustrated easily, and loses the train of thought.  Otherwise, her long-term memory is not as affected.  She reports significant amount of stress, caring for her husband who has worsening dementia. Patient lives with: with spouse.  She reports that she would really like to consider sending her husband to an adult living facility, but her daughter who lives in [place], who is the healthcare power of attorney, would never allow that.  She reports that her level of stress  is "tremendous ".  "He follows me everywhere I go and I had to take it whenever I go ". repeats oneself? Denies .  She called her daughter 3 times this morning and wanted to know why she was going to the appointment today. Disoriented when walking into a room?  Patient denies   Leaving objects in unusual places?  Patient denies   Ambulates  with difficulty?   Uses L cane for balance.  "She has fallen down the handicap ramp built onto the front porch recently.  Both her and her husband are unsteady with walking and needs a cane to need to hold onto the wall or another person.  "Her daughter reports that she has  difficulty getting in and out of her car and walking to and from the house. Recent falls?  Patient denies   Any head injuries?  Patient denies   History of seizures?   Patient denies   Wandering behavior?  Patient denies   Patient drives?   Denies, although she has issues parallel parking.  Any mood changes such irritability agitation?  She is somewhat more irritable, but she attributes this to her level of stress at home caring for her husband.  According to her daughter, the patient has been showing lack of empathy for her spouse's condition.  She attempts to cancel her medical appointments, stating that "she does not feel well and does not want to go today ". Any history of depression?:  She reports that since her daughter left 5 years ago, to leaving [place], she has been more depressed, because she cannot see her that often. Hallucinations?  Patient denies   Paranoia?  Patient denies   Patient reports that she sometimes has days and night mixed up, especially during the last year.  She denies any vivid dreams, REM behavior or sleepwalking.   History of sleep apnea?  Patient denies   Any hygiene concerns?  Patient denies but her sister reports that she is more disheveled than before, she does not pay attention to her personal grooming as she did in prior years.  Her daughter reports that her house is not clean, she has hoarder type of behaviors.  She refuses to get rid of anything or receive assistance with cleaning her home or cooking.  She gets Meals on Wheels service in [place], but does not usually eat her frozen meals according to daughter. Independent of bathing and dressing?  Endorsed  Does the patient needs help with medications? Denies.  Her brother-in-law makes a tray because according to him, she has been missing at least 3 doses of medicine weekly for herself and for her spouse.  "She does not seem to care if she takes her medication or not " Who is in charge of the finances?   Daughter in control.  Any changes in appetite?  Patient denies   Patient have trouble swallowing? Patient denies   Does the patient cook?  Patient denies .  Sometimes he opens a can of food item to split with spouse or has him who can jam an egg. Any kitchen accidents such as leaving the stove on? Patient denies   Any headaches?  Patient denies   Double vision? Patient denies   Any focal numbness or tingling?  Patient denies   Chronic back pain Patient denies .  He does attend a pain clinic for DDD and other musculoskeletal issues. Unilateral weakness?  Patient denies   Any tremors?  Patient denies  Any history of anosmia?  Patient denies   Any incontinence of urine?  Patient denies   Any bowel dysfunction?   Patient denies   History of heavy alcohol intake?  Patient denies   History of heavy tobacco use?  Patient denies   Family history of dementia?  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  She has been exposed to radium and sulfur for many years.     MRI brain 06/01/22  Parenchymal volume is within expected limits for age with no disproportionate hippocampal atrophy.2. Mild-to-moderate chronic small vessel ischemic changes not significantly changed since 2021. 3. No acute finding.    Labs 05/2022 TSH 2.39, B12 303.   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 Patient and I have discussed the hazardous effects of continued opiate pain medication usage. Risks and benefits of above medications including but not limited to possibility of hyperalgesia, respiratory depression, sedation, and even death were  discussed with the patient who expressed an understanding. Patient    Constipation    DDD (degenerative disc disease), lumbar 09/01/2021   Last Assessment & Plan:  Formatting of this note might be different from the original. 77 year old female seen today in routine follow-up for chronic lower back pain without true radicular symptoms.  Her sister is present with her today as she normally is.    Her most recent lumbar spine MRI was completed in 2016.  Unfortunately, there is no report or imaging to review in her EMR.  She did complet   Diabetes mellitus without complication (HCC)    borderline   Diverticulitis    Edema    Fibromyalgia    Fibromyalgia    GERD (gastroesophageal reflux disease)    Hyperlipidemia    Hypertension    Lumbar spondylolysis 09/01/2021   Last Assessment & Plan:  Formatting of this note might be different from the original. See lumbar DDD plan   Lumbar spondylosis 03/24/2022   Malaise and fatigue    Neuropathy 03/24/2022   Obesity    Osteoarthritis    Osteoporosis, postmenopausal    Other fatigue 02/16/2017   Pain in joint, lower leg 12/25/2015   Pain medication agreement 09/01/2021   Last Assessment & Plan:  Formatting of this note might be different from the original. Agreement up-to-date.   UDS to be collected today in compliance with clinic policies and procedures.   Patient did not display any signs of abuse or diversion and has been checked on the [facility] Controlled Substance website.   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   Sciatica, left side 06/27/2017   Type 2 diabetes, controlled, with neuropathy ([place])    Vitamin D deficiency      Past Surgical History:  Procedure Laterality Date   ANKLE FUSION Left    CHOLECYSTECTOMY     COLONOSCOPY     TOTAL KNEE ARTHROPLASTY Right 03/08/2016   Procedure:  TOTAL KNEE ARTHROPLASTY;   Surgeon: Vickey Huger, MD;  Location: [facility];  Service: Orthopedics;  Laterality: Right;     PREVIOUS MEDICATIONS:   CURRENT MEDICATIONS:  Outpatient Encounter Medications as of 10/28/2022  Medication Sig   albuterol (PROVENTIL HFA;VENTOLIN HFA) 108 (90 Base) MCG/ACT inhaler Inhale 1-2 puffs into the lungs every 6 (six) hours as needed for wheezing or shortness of breath.   alendronate (FOSAMAX) 70 MG tablet Take 70 mg by mouth once a week.   aspirin EC 81 MG tablet Take 81 mg by mouth at bedtime.  (Patient not taking: Reported on 05/18/2022)   benazepril-hydrochlorthiazide (LOTENSIN HCT) 20-25 MG tablet Take 1 tablet by mouth daily.   celecoxib (CELEBREX) 200 MG capsule Take 200 mg by mouth daily.   cyclobenzaprine (FLEXERIL) 10 MG tablet Take 10 mg by mouth daily.    donepezil (ARICEPT) 5 MG tablet Start Donepezil '5mg'$  daily. Side effects discussed   Dulaglutide (TRULICITY) 7.34 LP/3.7TK SOPN Inject 0.75 mg into the skin once a week.   ergocalciferol (VITAMIN D2) 1.25 MG (50000 UT) capsule Take 50,000 Units by mouth once a week.   escitalopram (LEXAPRO) 10 MG tablet Take 10 mg by mouth daily.   ketoconazole (NIZORAL) 2 % cream Apply 1 application topically 2 (two) times daily.   metFORMIN (GLUCOPHAGE-XR) 500 MG 24 hr tablet Take 500 mg by mouth 2 (two) times daily.    mometasone-formoterol (DULERA) 200-5 MCG/ACT AERO Inhale 2 puffs into the lungs 2 (two) times daily.   omeprazole (PRILOSEC) 40 MG capsule Take 40 mg by mouth daily.   rosuvastatin (CRESTOR) 10 MG tablet Take 10 mg by mouth every evening.   spironolactone (ALDACTONE) 25 MG tablet Take 25 mg by mouth daily.   traMADol (ULTRAM) 50 MG tablet Take 50 mg by mouth every 8 (eight) hours as needed for pain.   No facility-administered encounter medications on file as of 10/28/2022.     Objective:     PHYSICAL EXAMINATION:    VITALS:  There were no vitals filed for this visit.  GEN:  The patient appears stated age and is in  NAD. HEENT:  Normocephalic, atraumatic.   Neurological examination:  General: NAD, well-groomed, appears stated age. Orientation: The patient is alert. Oriented to person, place and date Cranial nerves: There is good facial symmetry.The speech is fluent and clear. No aphasia or dysarthria. Fund of knowledge is appropriate. Recent memory impaired and remote memory is normal.  Attention and concentration are normal.  Able to name objects and repeat phrases.  Hearing is intact to conversational tone.   Delayed recall *** Sensation: Sensation is intact to light touch throughout Motor: Strength is at least antigravity x4. Tremors: none  DTR's 2/4 in UE/LE      05/20/2022    9:00 AM  Montreal Cognitive Assessment   Visuospatial/ Executive (0/5) 4  Naming (0/3) 2  Attention: Read list of digits (0/2) 2  Attention: Read list of letters (0/1) 1  Attention: Serial 7 subtraction starting at 100 (0/3) 3  Language: Repeat phrase (0/2) 2  Language : Fluency (0/1) 0  Abstraction (0/2) 1  Delayed Recall (0/5) 1  Orientation (0/6) 5  Total 21  Adjusted Score (based on education) 21        No data to display             Movement examination: Tone: There is normal tone in the UE/LE Abnormal movements:  no tremor.  No myoclonus.  No asterixis.  Coordination:  There is no decremation with RAM's. Normal finger to nose  Gait and Station: The patient has no difficulty arising out of a deep-seated chair without the use of the hands. The patient's stride length is good.  Gait is cautious and narrow.   Thank you for allowing [place] the opportunity to participate in the care of this nice patient. Please do not hesitate to contact us for any questions or concerns.   Total time spent on today's visit was *** minutes dedicated to this patient today, preparing to see patient, examining the patient, ordering tests and/or medications and counseling the patient, documenting clinical information in the EHR or  other health record, independently interpreting results and communicating results to the patient/family, discussing treatment and goals, answering patient's questions and coordinating care.  Cc:  Nicoletta Dress, MD  Sharene Butters 10/28/2022 7:31 AM

## 2022-11-03 DIAGNOSIS — E559 Vitamin D deficiency, unspecified: Secondary | ICD-10-CM | POA: Diagnosis not present

## 2022-11-03 DIAGNOSIS — I10 Essential (primary) hypertension: Secondary | ICD-10-CM | POA: Diagnosis not present

## 2022-11-03 DIAGNOSIS — E1140 Type 2 diabetes mellitus with diabetic neuropathy, unspecified: Secondary | ICD-10-CM | POA: Diagnosis not present

## 2022-11-09 ENCOUNTER — Telehealth: Payer: Self-pay

## 2022-11-09 ENCOUNTER — Telehealth: Payer: Self-pay | Admitting: *Deleted

## 2022-11-09 NOTE — Progress Notes (Unsigned)
  Care Coordination Note  11/09/2022 Name: [name] MRN: [id number] DOB: 22-Mar-1946  Kristin Flores is a 77 y.o. year old female who is a primary care patient of Nicoletta Dress, MD and is actively engaged with the care management team. I reached out to [employer] by phone today to assist with scheduling an initial visit with the Licensed Clinical Social Worker  Follow up plan: Unsuccessful telephone outreach attempt made. A HIPAA compliant phone message was left for the patient providing contact information and requesting a return call.   Julian Hy, [place] Direct Dial: [phone]

## 2022-11-09 NOTE — Patient Outreach (Signed)
Received a Pharmacy and Social Work referral for Ms. Showers. I have sent this referral to the [place] and Eduard Clos LCSW for follow up.     [facility], [place], [place] Management [phone]

## 2022-11-09 NOTE — Patient Outreach (Signed)
  Care Coordination   Initial Visit Note   11/09/2022 Name: Kristin Flores MRN: [id number] DOB: [birth year]  Kristin Flores is a 77 y.o. year old female who sees Nicoletta Dress, MD for primary care. I  spoke with daughter [name] who is HCPOA  What matters to the patients health and wellness today?  Spoke with daughter today and offered [facility] care coordination program.  Daughter lives in [place] and parents live here.  Daughter reports that both her parents have significant memory concerns. Daughter states that her parents are not taking their medications. Daughter states that she facetimes with them daily to get them to take their medications.  Daughter reports that her parents are neglecting personal care.  Current this patient is pending a neuro follow up on 11/26/2022.  Patient is supported by her sister and brother in law who live behind patients home.  Daughter reports PCP is aware of concerns.  Daughter considering adult daycare or placement in memory unit.     Goals Addressed               This Visit's Progress     Daughter requesting help with level of care, depression, caregiver stress and medication adherence (pt-stated)        Interventions Today    Flowsheet Row Most Recent Value  Chronic Disease Discussed/Reviewed   Chronic disease discussed/reviewed during today's visit Other  [memory issues and level of care concerns]  General Interventions   General Interventions Discussed/Reviewed General Interventions Discussed  Mental Health Interventions   Mental Health Discussed/Reviewed Refer to Social Work for resources, Refer to Social Work for counseling, [place] Discussed  Refer to Social Work for counseling regarding Depression  [daughter reports patient is depressed and overwhelmed with caring for her husband]  Refer to Social Work for resources regarding Adult Daycare, Assisted Living/Skilled [place] Interventions   Pharmacy Dicussed/Reviewed  Medication Adherence, Referral to Pharmacist  Medication Adherence Not taking medication  [daughter reports patient is not taking her medications. Medication planner filled by a family member.]  Safety Interventions   Safety Discussed/Reviewed Safety Discussed  [daughter is concerned about safety.  Patient has refused assistance]              SDOH assessments and interventions completed:  No     Care Coordination Interventions:  Yes, provided   Follow up plan: Referral made to pharmacy and SW    Encounter Outcome:  Pt. Visit Completed   Tomasa Rand, RN, BSN, CEN [place] Coordinator [phone]

## 2022-11-10 NOTE — Progress Notes (Signed)
  Care Coordination   Note   11/10/2022 Name: [name] MRN: [id number] DOB: [birth year]  Kristin Flores is a 77 y.o. year old female who sees Nicoletta Dress, MD for primary care. I reached out to [employer] by phone today to offer care coordination services.  [name] was given information about Care Coordination services today including:   The Care Coordination services include support from the care team which includes your Nurse Coordinator, Clinical Social Worker, or Pharmacist.  The Care Coordination team is here to help remove barriers to the health concerns and goals most important to you. Care Coordination services are voluntary, and the patient may decline or stop services at any time by request to their care team member.   Care Coordination Consent Status: Patient agreed to services and verbal consent obtained.   Follow up plan:  Telephone appointment with care coordination team member scheduled for:  11/15/2022  Encounter Outcome:  Pt. Scheduled  Julian Hy, [place] Direct Dial: [phone]

## 2022-11-12 ENCOUNTER — Telehealth: Payer: Self-pay

## 2022-11-12 NOTE — Progress Notes (Signed)
[facility]) Care Management  [place]   11/12/2022  Kristin Flores 1946-04-08 [id number]  Reason for referral: Medication adherence   Referral source: Tomasa Rand, RN ; Narc[place] team received a referral for medication adherence. Per chart review in [facility]) EMR, patient needs dementia diagnosis for home health and was scheduled to see a neurologist on 10/28/2022. However, per [facility] notes, patient was having mental illness issues and was unable to attend appointment. Per pharmacy claims data, patient is adherent with almost all medication except for Ozempic (per EMR she receives medication assistance).   This pharmacist called and spoke with daughter [name] who stated that her father has dementia. Her uncle picks up her parents medication and fill pillboxes for them, but he is also elderly. [name] Levering her parents every morning and evening to make sure they are taking their medication. However, she reports that her mother is difficult. Informed Ms. Meney that given her father has dementia and is not able to be a caregiver and her mother has suspected dementia, it would be difficult to adequately manage and review medications.   Discussed with Ms. [name] the option for mail order with [facility], [facility] compliance packaging, and independent pharmacy compliance packaging. She was interested in compliance packaging and will send a document to help aid Ms. Meney in her decision switching pharmacy.    Thank you for allowing pharmacy to be a part of this patient's care. Kristeen Miss, PharmD Clinical Pharmacist [facility] Cell: [phone]

## 2022-11-13 ENCOUNTER — Other Ambulatory Visit: Payer: Self-pay | Admitting: Cardiology

## 2022-11-15 ENCOUNTER — Ambulatory Visit: Payer: Self-pay | Admitting: Licensed Clinical Social Worker

## 2022-11-15 NOTE — Patient Instructions (Signed)
Visit Information  Thank you for taking time to visit with me today. Please don't hesitate to contact me if I can be of assistance to you before our next scheduled telephone appointment.  Following are the goals we discussed today:   Our next appointment is by telephone on 12/21/22 at 2:00 PM   Please call the care guide team at [phone] if you need to cancel or reschedule your appointment.   If you are experiencing a Mental Health or [place] or need someone to talk to, please go to [facility] [place] [phone])   Following is a copy of your full plan of care:   Interventions LCSW talked via phone today with [name], daughter of client, about level of care needs of client LCSW informed  [name] of program support LCSW and [name] discussed fact that client has memory issues and sometimes cannot remember to take her prescribed medications Discussed sleeping issues of client. Discussed stress/anxiety of client. [name] said client is stressed and anxious regularly related to managing care needs of spouse of client. Spouse of client has dementia Discussed pain issues of client.  [name] said client has back pain issues. Discussed appetite of client. [name] said client has decreased appetite Discussed fact that client has appointment with neurologist on 11/26/22.  Discussed FL-2 completion process and discussed family touring of care facilities related to client possible needs.  Discussed in home care support. LCSW reviewed home care agencies with [name] related to client needs. [name] said she has list of about 5-6 agencies she plans to call to discuss in home care support for client Provided counseling support for [name] related to care needs of [place] client medical support with NP Vicente Males) of office of Dr. Delena Bali   Ms. Staley was given information about Care Management services by the  embedded care coordination team including:  Care Management services include personalized support from designated clinical staff supervised by her physician, including individualized plan of care and coordination with other care providers 24/7 contact phone numbers for assistance for urgent and routine care needs. The patient may stop CCM services at any time (effective at the end of the month) by phone call to the office staff.  Patient agreed to services and verbal consent obtained.   Norva Riffle.Cletus Paris MSW, [place] Holiday representative [facility] Care Management [phone]

## 2022-11-15 NOTE — Telephone Encounter (Signed)
Rx refill sent to pharmacy. 

## 2022-11-15 NOTE — Patient Outreach (Signed)
  Care Coordination   Initial Visit Note   11/15/2022 Name: Kristin Flores MRN: [id number] DOB: [birth year]  Kristin Flores is a 77 y.o. year old female who sees Nicoletta Dress, MD for primary care. I spoke with Kristin Flores, daughter of patient, by phone today about patient needs. .  What matters to the patients health and wellness today? Client has issues related to level of care and meeting care needs daily     Goals Addressed             This Visit's Progress    LCSW talked via phone today with patient daughter, Kristin Flores about level of care concerns for client       Interventions LCSW talked via phone today with Kristin Flores, daughter of client, about level of care needs of client LCSW informed  Kristin Flores of program support LCSW and Kristin Flores discussed fact that client has memory issues and sometimes cannot remember to take her prescribed medications Discussed sleeping issues of client. Discussed stress/anxiety of client. Kristin Flores said client is stressed and anxious regularly related to managing care needs of spouse of client. Spouse of client has dementia Discussed pain issues of client.  Kristin Flores said client has back pain issues. Discussed appetite of client. Kristin Flores said client has decreased appetite Discussed fact that client has appointment with neurologist on 11/26/22.  Discussed FL-2 completion process and discussed family touring of care facilities related to client possible needs.  Discussed in home care support. LCSW reviewed home care agencies with Kristin Flores related to client needs. Kristin Flores said she has list of about 5-6 agencies she plans to call to discuss in home care support for client Provided counseling support for Kristin Flores related to care needs of [place] client medical support with NP Vicente Males) of office of Dr. Delena Bali      SDOH assessments and interventions completed:  Yes  SDOH Interventions Today    Flowsheet Row Most Recent Value   SDOH Interventions   Depression Interventions/Treatment  Medication  Physical Activity Interventions Other (Comments)  [client may have walking challenges. client does have memory challenges]  Stress Interventions Other (Comment)  [client has stress related to helping with care needs of her spouse. She has memory issues herself]        Care Coordination Interventions:  Yes, provided   Interventions Today    Flowsheet Row Most Recent Value  Chronic Disease Discussed/Reviewed   Chronic disease discussed/reviewed during today's visit Other  [anxiety and depression]  General Interventions   General Interventions Discussed/Reviewed General Interventions Discussed  [discussed RN support, Pharmacy support, LCSW support]  Education Interventions   Education Provided Provided Verbal Education  Provided Dance movement psychotherapist  [Discussed with daughter, Kristin Flores, FL-2 document for client]  Applications FL-2  Mental Health Interventions   Mental Health Discussed/Reviewed Coping Strategies  Refer to Social Work for counseling regarding Depression, Anxiety/Coping  Refer to Social Work for resources regarding Adult Daycare, Assisted Living/Skilled [place]  [Discussed PACE program, SNF, ALF care]  Pharmacy Interventions   Medication Adherence Not taking medication        Follow up plan: Follow up call scheduled for 12/21/22 at 2:00 PM     Encounter Outcome:  Pt. Visit Completed   Norva Riffle.Montray Kliebert MSW, [place] Holiday representative [facility] Care Management [phone]

## 2022-11-26 ENCOUNTER — Ambulatory Visit (INDEPENDENT_AMBULATORY_CARE_PROVIDER_SITE_OTHER): Payer: Medicare Other | Admitting: Physician Assistant

## 2022-11-26 ENCOUNTER — Encounter: Payer: Self-pay | Admitting: Physician Assistant

## 2022-11-26 VITALS — BP 121/83 | HR 62 | Resp 18 | Ht 65.0 in | Wt 208.0 lb

## 2022-11-26 DIAGNOSIS — Z6379 Other stressful life events affecting family and household: Secondary | ICD-10-CM

## 2022-11-26 DIAGNOSIS — G3184 Mild cognitive impairment, so stated: Secondary | ICD-10-CM | POA: Diagnosis not present

## 2022-11-26 NOTE — Progress Notes (Signed)
Assessment/Plan:   Mild cognitive impairment  Schronda Blicharz Argabright is a very pleasant 77 y.o. RH female with a history of hypertension, hyperlipidemia, anxiety, depression, non adherence to medications, presenting today in follow-up for evaluation of memory loss. Patient is on donepezil 5 mg daily.On questioning, she denies any cognitive decline, although the family is aware that they might be some memory issue. Personally reviewed of the brain from 06/01/2022, was  remarkable for normal parenchymal volume for age, without any hypoccampal atrophy, and mild to moderate chronic small vessel ischemic changes, not significantly changed since 2021. Today's MMSE is 27/30, essentially stable from prior evaluation.  At this point, despite family concerned that the patient's memory may be worse, this is still unclear in view of her underlying psychiatric (major depression) issue,  hindering the ability to determine the level of decline in her condition.  There is a component of home stress, and she is scheduled to see behavioral health in the near future. She is the only 24/7 caregiver to her husband with dementia which may add to stress. She feels that he is hindering the ability to be caring for her.    Recommendations:   Follow up in 6 months. Recommend good control of cardiovascular risk factors Recommend B12 supplements daily and follow-up with PCP Recommend that SW is involved to arrange [facility] services for her for medication and meals   Patient will benefit from psychiatry and psychotherapy for situational depression and stress. She is on a waitlist at [facility] Continue donepezil 5 mg daily, side effects discussed Once her behavioral issues are addressed, will schedule a neuropsychological evaluation for clarity of the diagnosis.    Subjective:   This patient is accompanied in the office by her sister  who supplements the history. Previous records as well as any outside records available were  reviewed prior to todays visit.   Patient was last seen on 06/23/2022 at which time her MoCA was 21/30 her family have written extensive information regarding her memory which will be a useful tool, as the patient is denying any issues.   Any changes in memory since last visit? Patient denies any memory issues, but family reports that her memory may be worse. She has trouble        remembering any doctors appointments or went to pick up the prescription refills. "She can't remember what she said"-family says ( this was present during her prior visits). repeats oneself?  Endorsed by family She calls 3-4 times an hour with same question. She has called her daughter 10 times in 1 day asking her when she is going to pick her up to go back to [place].  (There are some underlying family dynamics between husband Chanetta Marshall and her daughter). When [facility] comes to visit her husband, HHN reports that she seems delusional with conversations". "Most of the day she is in the couch watching TV or sitting in the recliner" Disoriented when walking into a room?  Patient denies Leaving objects in unusual places?  Patient denies   Wandering behavior?   denies   Any personality changes since last visit?  Gets frustrated and overwhelmed easily""She is not acting as a caregiver to her husband but rather she is constantly angry that he is unable to take care of her"-sister says.  Any worsening depression?: "possible". Once again, the strained relationship between her husband and daughter, has brought significant tension to patient. Both patient and her husband claim that her daughter did not want them to stay  and drove them back to their home" Hallucinations or paranoia?  denies   Seizures?   denies    Any sleep changes?  Denies  vivid dreams, REM behavior or sleepwalking   Sleep apnea?   denies   Any hygiene concerns?  " Personal hygiene has deteriorated noticeably. She no longer bathes daily and does not brush or wash he hair  appropriately" Independent of bathing and dressing?  Endorsed  Does the patient needs help with medications? She is in charge, but has missed several doses. She has also forgotten to give the meds to her husband. She blames her daughter for not calling them to remind them-sister says. "When speaking with her daughter she blames her husband for not taking the med, saying "he is a big boy, he can take the medicine himself"   Who is in charge of the finances?   is in charge     Any changes in appetite?  Not keeping a good diet and misses meals per family report.  Patient have trouble swallowing?  denies   Does the patient cook? Yes  "eggs or open canned food "." Her daughter canceled meals on wheels because they did not like the food choices  and would rather eat at a restaurant" -sister says Any kitchen accidents such as leaving the stove on?   denies   Any headaches?    denies   Vision changes? denies Chronic back pain  denies   Ambulates with difficulty?  Uses a cane due to pain, for stability  Recent falls or head injuries?    "She has fallen down the ramp on her front deck twice since last visit" Refuses exercising because of chronic pain.  Unilateral weakness, numbness or tingling?   denies   Any tremors?  denies   Any anosmia?    denies   Any incontinence of urine?  denies   Any bowel dysfunction?  " She spends an inordinate amount of time in bed complaining of GI problems or just not feeling good" Patient lives  with husband. They refuse to move to ALF according to family. Does the patient drive? 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  She reports having memory difficulties for the last year, "when my health took a turn for the worse ".  She states that her short-term memory is worse, she cannot remember people conversations, or remember the names of people that she  actually knows. She gets frustrated easily, and loses the train of thought.  Otherwise, her long-term memory is not as affected.  She reports significant amount of stress, caring for her husband who has worsening dementia. Patient lives with: with spouse.  She reports that she would really like to consider sending her husband to an adult living facility, but her daughter who lives in [place], who is the healthcare power of attorney, would never allow that.  She reports that her level of stress is "tremendous ".  "He follows me everywhere I go and I had to take it whenever I go ". repeats oneself? Denies .  She called her daughter 3 times this morning and wanted to know why she was going to the appointment today. Disoriented when walking into a room?  Patient denies   Leaving objects in unusual places?  Patient denies   Ambulates  with difficulty?   Uses L  cane for balance.  "She has fallen down the handicap ramp built onto the front porch recently.  Both her and her husband are unsteady with walking and needs a cane to need to hold onto the wall or another person.  "Her daughter reports that she has difficulty getting in and out of her car and walking to and from the house. Recent falls?  Patient denies   Any head injuries?  Patient denies   History of seizures?   Patient denies   Wandering behavior?  Patient denies   Patient drives?   Denies, although she has issues parallel parking.   Any mood changes such irritability agitation?  She is somewhat more irritable, but she attributes this to her level of stress at home caring for her husband.  According to her daughter, the patient has been showing lack of empathy for her spouse's condition.  She attempts to cancel her medical appointments, stating that "she does not feel well and does not want to go today ". Any history of depression?:  She reports that since her daughter left 5 years ago, to leaving [place], she has been more depressed, because she  cannot see her that often. Hallucinations?  Patient denies   Paranoia?  Patient denies   Patient reports that she sometimes has days and night mixed up, especially during the last year.  She denies any vivid dreams, REM behavior or sleepwalking.   History of sleep apnea?  Patient denies   Any hygiene concerns?  Patient denies but her sister reports that she is more disheveled than before, she does not pay attention to her personal grooming as she did in prior years.  Her daughter reports that her house is not clean, she has hoarder type of behaviors.  She refuses to get rid of anything or receive assistance with cleaning her home or cooking.  She gets Meals on Wheels service in [place], but does not usually eat her frozen meals according to daughter. Independent of bathing and dressing?  Endorsed  Does the patient needs help with medications? Denies.  Her brother-in-law makes a tray because according to him, she has been missing at least 3 doses of medicine weekly for herself and for her spouse.  "She does not seem to care if she takes her medication or not " Who is in charge of the finances?  Daughter in control.  Any changes in appetite?  Patient denies   Patient have trouble swallowing? Patient denies   Does the patient cook?  Patient denies .  Sometimes he opens a can of food item to split with spouse or has him who can jam an egg. Any kitchen accidents such as leaving the stove on? Patient denies   Any headaches?  Patient denies   Double vision? Patient denies   Any focal numbness or tingling?  Patient denies   Chronic back pain Patient denies .  He does attend a pain clinic for DDD and other musculoskeletal issues. Unilateral weakness?  Patient denies   Any tremors?  Patient denies   Any history of anosmia?  Patient denies   Any incontinence of urine?  Patient denies   Any bowel dysfunction?   Patient denies   History of heavy alcohol intake?  Patient denies   History of heavy  tobacco use?  Patient denies   Family history of dementia?  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  She has been  exposed to radium and sulfur for many years.     MRI brain 06/01/22  Parenchymal volume is within expected limits for age with no disproportionate hippocampal atrophy.2. Mild-to-moderate chronic small vessel ischemic changes not significantly changed since 2021. 3. No acute finding.  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 Patient and I have discussed the hazardous effects of continued opiate pain medication usage. Risks and benefits of above medications including but not limited to possibility of hyperalgesia, respiratory depression, sedation, and even death were discussed with the patient who expressed an understanding. Patient    Constipation    DDD (degenerative disc disease), lumbar 09/01/2021   Last Assessment & Plan:  Formatting of this note might be different from the original. 77 year old female seen today in routine follow-up for chronic lower back pain without true radicular symptoms.  Her sister is present with her today as she normally is.    Her most recent lumbar spine MRI was completed in 2016.  Unfortunately, there is no report or imaging to review in her EMR.  She did complet   Diabetes mellitus without complication (HCC)    borderline   Diverticulitis    Edema    Fibromyalgia    Fibromyalgia    GERD (gastroesophageal reflux disease)    Hyperlipidemia    Hypertension    Lumbar spondylolysis 09/01/2021   Last Assessment & Plan:  Formatting of this note might be different from  the original. See lumbar DDD plan   Lumbar spondylosis 03/24/2022   Malaise and fatigue    Neuropathy 03/24/2022   Obesity    Osteoarthritis    Osteoporosis, postmenopausal    Other fatigue 02/16/2017   Pain in joint, lower leg 12/25/2015   Pain medication agreement 09/01/2021   Last Assessment & Plan:  Formatting of this note might be different from the original. Agreement up-to-date.   UDS to be collected today in compliance with clinic policies and procedures.   Patient did not display any signs of abuse or diversion and has been checked on the [facility] Controlled Substance website.   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   Sciatica, left side 06/27/2017   Type 2 diabetes, controlled, with neuropathy (HCC)    Vitamin D deficiency      Past Surgical History:  Procedure Laterality Date   ANKLE FUSION Left    CHOLECYSTECTOMY     COLONOSCOPY     TOTAL KNEE ARTHROPLASTY Right 03/08/2016   Procedure: TOTAL KNEE ARTHROPLASTY;  Surgeon: Dannielle Huh, MD;  Location: [facility] OR;  Service: Orthopedics;  Laterality: Right;     PREVIOUS MEDICATIONS:   CURRENT MEDICATIONS:  Outpatient Encounter Medications as of 11/26/2022  Medication Sig   albuterol (PROVENTIL HFA;VENTOLIN HFA) 108 (90 Base) MCG/ACT inhaler Inhale 1-2 puffs into the lungs every 6 (six) hours as needed for wheezing or shortness of breath.   alendronate (FOSAMAX) 70 MG tablet Take 70 mg by mouth once a week.  benazepril-hydrochlorthiazide (LOTENSIN HCT) 20-25 MG tablet Take 1 tablet by mouth daily.   celecoxib (CELEBREX) 200 MG capsule Take 200 mg by mouth daily.   cyclobenzaprine (FLEXERIL) 10 MG tablet Take 10 mg by mouth daily.    donepezil (ARICEPT) 5 MG tablet Start Donepezil 5mg  daily. Side effects discussed   Dulaglutide (TRULICITY) 0.75 MG/0.5ML SOPN Inject 0.75 mg into the skin once a week.    ergocalciferol (VITAMIN D2) 1.25 MG (50000 UT) capsule Take 50,000 Units by mouth once a week.   escitalopram (LEXAPRO) 10 MG tablet Take 10 mg by mouth daily.   ketoconazole (NIZORAL) 2 % cream Apply 1 application topically 2 (two) times daily.   metFORMIN (GLUCOPHAGE-XR) 500 MG 24 hr tablet Take 500 mg by mouth 2 (two) times daily.    mometasone-formoterol (DULERA) 200-5 MCG/ACT AERO Inhale 2 puffs into the lungs 2 (two) times daily.   omeprazole (PRILOSEC) 40 MG capsule Take 40 mg by mouth daily.   rosuvastatin (CRESTOR) 10 MG tablet Take 10 mg by mouth every evening.   spironolactone (ALDACTONE) 25 MG tablet Take 1 tablet (25 mg total) by mouth daily. Pt needs office visit before future refills.   traMADol (ULTRAM) 50 MG tablet Take 50 mg by mouth every 8 (eight) hours as needed for pain.   aspirin EC 81 MG tablet Take 81 mg by mouth at bedtime.  (Patient not taking: Reported on 11/26/2022)   No facility-administered encounter medications on file as of 11/26/2022.     Objective:     PHYSICAL EXAMINATION:    VITALS:   Vitals:   11/26/22 1421  BP: 121/83  Pulse: 62  Resp: 18  SpO2: 98%  Weight: 208 lb (94.3 kg)  Height: 5\' 5"  (1.651 m)    GEN:  The patient appears stated age and is in NAD. HEENT:  Normocephalic, atraumatic.   Neurological examination:  General: NAD, well-groomed, appears stated age. Orientation: The patient is alert. Oriented to person, place and date.  Flat affect. Cranial nerves: There is good facial symmetry.The speech is fluent and clear. No aphasia or dysarthria. Fund of knowledge is appropriate. Recent memory impaired and remote memory is normal.  Attention and concentration are normal.  Able to name objects and repeat phrases.  Hearing is intact to conversational tone.   Delayed recall 1/3 Sensation: Sensation is intact to light touch throughout Motor: Strength is at least antigravity x4. DTR's 1/4 in UE/LE      05/20/2022    9:00 AM  [place]  Cognitive Assessment   Visuospatial/ Executive (0/5) 4  Naming (0/3) 2  Attention: Read list of digits (0/2) 2  Attention: Read list of letters (0/1) 1  Attention: Serial 7 subtraction starting at 100 (0/3) 3  Language: Repeat phrase (0/2) 2  Language : Fluency (0/1) 0  Abstraction (0/2) 1  Delayed Recall (0/5) 1  Orientation (0/6) 5  Total 21  Adjusted Score (based on education) 21       11/26/2022    3:00 PM  MMSE - Mini Mental State Exam  Orientation to time 4  Orientation to Place 5  Registration 3  Attention/ Calculation 5  Recall 1  Language- name 2 objects 2  Language- repeat 1  Language- follow 3 step command 3  Language- read & follow direction 1  Write a sentence 1  Copy design 1  Total score 27       Movement examination: Tone: There is normal tone in the UE/LE Abnormal movements:  no  tremor.  No myoclonus.  No asterixis.   Coordination:  There is no decremation with RAM's. Normal finger to nose  Gait and Station: The patient has some difficulty arising out of a deep-seated chair without the use of the hands.  Uses a cane. The patient's stride length is good.  Gait is cautious and narrow.   Thank you for allowing [place] the opportunity to participate in the care of this nice patient. Please do not hesitate to contact us for any questions or concerns.   Total time spent on today's visit was 30 minutes dedicated to this patient today, preparing to see patient, examining the patient, ordering tests and/or medications and counseling the patient, documenting clinical information in the EHR or other health record, independently interpreting results and communicating results to the patient/family, discussing treatment and goals, answering patient's questions and coordinating care.  Cc:  Paulina Fusi, MD  Marlowe Kays 11/27/2022 2:25 PM

## 2022-11-26 NOTE — Patient Instructions (Signed)
It was a pleasure to see you today at our office.   Recommendations:  Continue B12 supplements daily Start Donepezil '5mg'$  daily. Side effects discussed   Follow up in 6 months   psychotherapy   Whom to call:  Memory  decline, memory medications: Call our office [phone]   For psychiatric meds, mood meds: Please have your primary care physician manage these medications.    For assessment of decision of mental capacity and competency:  Call Dr. Anthoney Harada, geriatric psychiatrist at [phone]  For guidance in geriatric dementia issues please call [facility] [phone]  For guidance regarding [facility] and if placement were needed at the facility, contact Arnell Asal, Social Worker tel: [phone]  If you have any severe symptoms of a stroke, or other severe issues such as confusion,severe chills or fever, etc call 911 or go to the ER as you may need to be evaluated further   Feel free to visit Facebook page " [facility]" for tips of how to care for people with memory problems.  /     RECOMMENDATIONS FOR ALL PATIENTS WITH MEMORY PROBLEMS: 1. Continue to exercise (Recommend 30 minutes of walking everyday, or 3 hours every week) 2. Increase social interactions - continue going to [place] and enjoy social gatherings with friends and family 3. Eat healthy, avoid fried foods and eat more fruits and vegetables 4. Maintain adequate blood pressure, blood sugar, and blood cholesterol level. Reducing the risk of stroke and cardiovascular disease also helps promoting better memory. 5. Avoid stressful situations. Live a simple life and avoid aggravations. Organize your time and prepare for the next day in anticipation. 6. Sleep well, avoid any interruptions of sleep and avoid any distractions in the bedroom that may interfere with adequate sleep quality 7. Avoid sugar, avoid sweets as there is a strong link between excessive sugar intake, diabetes,  and cognitive impairment We discussed the Mediterranean diet, which has been shown to help patients reduce the risk of progressive memory disorders and reduces cardiovascular risk. This includes eating fish, eat fruits and green leafy vegetables, nuts like almonds and hazelnuts, walnuts, and also use olive oil. Avoid fast foods and fried foods as much as possible. Avoid sweets and sugar as sugar use has been linked to worsening of memory function.  There is always a concern of gradual progression of memory problems. If this is the case, then we may need to adjust level of care according to patient needs. Support, both to the patient and caregiver, should then be put into place.      You have been referred for a neuropsychological evaluation (i.e., evaluation of memory and thinking abilities). Please bring someone with you to this appointment if possible, as it is helpful for the doctor to hear from both you and another adult who knows you well. Please bring eyeglasses and hearing aids if you wear them.    The evaluation will take approximately 3 hours and has two parts:   The first part is a clinical interview with the neuropsychologist (Dr. Melvyn Novas or Dr. Nicole Kindred). During the interview, the neuropsychologist will speak with you and the individual you brought to the appointment.    The second part of the evaluation is testing with the doctor's technician Hinton Dyer or Maudie Mercury). During the testing, the technician will ask you to remember different types of material, solve problems, and answer some questionnaires. Your family member will not be present for this portion of the evaluation.   Please  note: We must reserve several hours of the neuropsychologist's time and the psychometrician's time for your evaluation appointment. As such, there is a No-Show fee of $100. If you are unable to attend any of your appointments, please contact our office as soon as possible to reschedule.    FALL PRECAUTIONS: Be cautious  when walking. Scan the area for obstacles that may increase the risk of trips and falls. When getting up in the mornings, sit up at the edge of the bed for a few minutes before getting out of bed. Consider elevating the bed at the head end to avoid drop of blood pressure when getting up. Walk always in a well-lit room (use night lights in the walls). Avoid area rugs or power cords from appliances in the middle of the walkways. Use a walker or a cane if necessary and consider physical therapy for balance exercise. Get your eyesight checked regularly.  FINANCIAL OVERSIGHT: Supervision, especially oversight when making financial decisions or transactions is also recommended.  HOME SAFETY: Consider the safety of the kitchen when operating appliances like stoves, microwave oven, and blender. Consider having supervision and share cooking responsibilities until no longer able to participate in those. Accidents with firearms and other hazards in the house should be identified and addressed as well.   ABILITY TO BE LEFT ALONE: If patient is unable to contact 911 operator, consider using LifeLine, or when the need is there, arrange for someone to stay with patients. Smoking is a fire hazard, consider supervision or cessation. Risk of wandering should be assessed by caregiver and if detected at any point, supervision and safe proof recommendations should be instituted.  MEDICATION SUPERVISION: Inability to self-administer medication needs to be constantly addressed. Implement a mechanism to ensure safe administration of the medications.   DRIVING: Regarding driving, in patients with progressive memory problems, driving will be impaired. We advise to have someone else do the driving if trouble finding directions or if minor accidents are reported. Independent driving assessment is available to determine safety of driving.   If you are interested in the driving assessment, you can contact the following:  The  [employer] in [place]  [place] [phone] or [phone]    [place] refers to food and lifestyle choices that are based on the traditions of countries located on the [employer]. This way of eating has been shown to help prevent certain conditions and improve outcomes for people who have chronic diseases, like kidney disease and heart disease. What are tips for following this plan? Lifestyle  Cook and eat meals together with your family, when possible. Drink enough fluid to keep your urine clear or pale yellow. Be physically active every day. This includes: Aerobic exercise like running or swimming. Leisure activities like gardening, walking, or housework. Get 7-8 hours of sleep each night. If recommended by your health care provider, drink red wine in moderation. This means 1 glass a day for nonpregnant women and 2 glasses a day for men. A glass of wine equals 5 oz (150 mL). Reading food labels  Check the serving size of packaged foods. For foods such as rice and pasta, the serving size refers to the amount of cooked product, not dry. Check the total fat in packaged foods. Avoid foods that have saturated fat or trans fats. Check the ingredients list for added sugars, such as corn syrup. Shopping  At [employer],  buy most of your food from the areas near the walls of the store. This includes: Fresh fruits and vegetables (produce). Grains, beans, nuts, and seeds. Some of these may be available in unpackaged forms or large amounts (in bulk). Fresh seafood. Poultry and eggs. Low-fat dairy products. Buy whole ingredients instead of prepackaged foods. Buy fresh fruits and vegetables in-season from local farmers markets. Buy frozen fruits and vegetables in resealable bags. If you do not have access to quality fresh seafood, buy  precooked frozen shrimp or canned fish, such as tuna, salmon, or sardines. Buy small amounts of raw or cooked vegetables, salads, or olives from the deli or salad bar at your store. Stock your pantry so you always have certain foods on hand, such as olive oil, canned tuna, canned tomatoes, rice, pasta, and beans. Cooking  Cook foods with extra-virgin olive oil instead of using butter or other vegetable oils. Have meat as a side dish, and have vegetables or grains as your main dish. This means having meat in small portions or adding small amounts of meat to foods like pasta or stew. Use beans or vegetables instead of meat in common dishes like chili or lasagna. Experiment with different cooking methods. Try roasting or broiling vegetables instead of steaming or sauteing them. Add frozen vegetables to soups, stews, pasta, or rice. Add nuts or seeds for added healthy fat at each meal. You can add these to yogurt, salads, or vegetable dishes. Marinate fish or vegetables using olive oil, lemon juice, garlic, and fresh herbs. Meal planning  Plan to eat 1 vegetarian meal one day each week. Try to work up to 2 vegetarian meals, if possible. Eat seafood 2 or more times a week. Have healthy snacks readily available, such as: Vegetable sticks with hummus. Greek yogurt. Fruit and nut trail mix. Eat balanced meals throughout the week. This includes: Fruit: 2-3 servings a day Vegetables: 4-5 servings a day Low-fat dairy: 2 servings a day Fish, poultry, or lean meat: 1 serving a day Beans and legumes: 2 or more servings a week Nuts and seeds: 1-2 servings a day Whole grains: 6-8 servings a day Extra-virgin olive oil: 3-4 servings a day Limit red meat and sweets to only a few servings a month What are my food choices? Mediterranean diet Recommended Grains: Whole-grain pasta. Brown rice. Bulgar wheat. Polenta. Couscous. Whole-wheat bread. [place]. Vegetables: Artichokes. Beets. Broccoli.  Cabbage. Carrots. Eggplant. Green beans. Chard. Kale. Spinach. Onions. Leeks. Peas. Squash. Tomatoes. Peppers. Radishes. Fruits: Apples. Apricots. Avocado. Berries. Bananas. Cherries. Dates. Figs. Grapes. Lemons. Melon. Oranges. Peaches. Plums. Pomegranate. Meats and other protein foods: Beans. Almonds. Sunflower seeds. Pine nuts. Peanuts. [employer]. Salmon. Scallops. Shrimp. [place]. Tilapia. Clams. Oysters. Eggs. Dairy: Low-fat milk. Cheese. Greek yogurt. Beverages: Water. Red wine. Herbal tea. Fats and oils: Extra virgin olive oil. Avocado oil. Grape seed oil. Sweets and desserts: [place] yogurt with honey. Baked apples. Poached pears. Trail mix. Seasoning and other foods: Basil. Cilantro. Coriander. Cumin. Mint. Parsley. Sage. Rosemary. Tarragon. Garlic. Oregano. Thyme. Pepper. Balsalmic vinegar. Tahini. Hummus. Tomato sauce. Olives. Mushrooms. Limit these Grains: Prepackaged pasta or rice dishes. Prepackaged cereal with added sugar. Vegetables: Deep fried potatoes (french fries). Fruits: Fruit canned in syrup. Meats and other protein foods: Beef. Pork. Lamb. Poultry with skin. Hot dogs. Berniece Salines. Dairy: Ice cream. Sour cream. Whole milk. Beverages: Juice. Sugar-sweetened soft drinks. Beer. Liquor and spirits. Fats and oils: Butter. Canola oil. Vegetable oil. Beef fat (tallow). Lard. Sweets and desserts: Cookies. Cakes. Pies. Candy. Seasoning  and other foods: Mayonnaise. Premade sauces and marinades. The items listed may not be a complete list. Talk with your dietitian about what dietary choices are right for you. Summary The Mediterranean diet includes both food and lifestyle choices. Eat a variety of fresh fruits and vegetables, beans, nuts, seeds, and whole grains. Limit the amount of red meat and sweets that you eat. Talk with your health care provider about whether it is safe for you to drink red wine in moderation. This means 1 glass a day for nonpregnant women and 2 glasses a day for men. A glass  of wine equals 5 oz (150 mL). This information is not intended to replace advice given to you by your health care provider. Make sure you discuss any questions you have with your health care provider. Document Released: 05/13/2016 Document Revised: 06/15/2016 Document Reviewed: 05/13/2016 Elsevier Interactive Patient Education  2017 [employer].    We have sent a referral to [facility] for your MRI and they will call you directly to schedule your appointment. They are located at [place]. If you need to contact them directly please call [phone].   Your provider has requested that you have labwork completed today. Please go to [facility] ([address]) on the second floor of this building before leaving the office today. You do not need to check in. If you are not called within 15 minutes please check with the front desk.

## 2022-12-21 ENCOUNTER — Ambulatory Visit: Payer: Medicare Other | Admitting: Physician Assistant

## 2022-12-21 ENCOUNTER — Ambulatory Visit: Payer: Self-pay | Admitting: Licensed Clinical Social Worker

## 2022-12-21 NOTE — Patient Outreach (Signed)
Care Coordination   Follow Up Visit Note   12/21/2022 Name: Kristin Flores MRN: [id number] DOB: Feb 01, 1946  Kristin Flores Kristin Flores is a 77 y.o. year old female who sees Kristin Dress, MD for primary care. I spoke with  Kristin Flores, daughter and contact of client by phone today.  What matters to the patients health and wellness today? Kristin Flores, daughter of client, is concerned about  memory issues of client and about client completion of ADLs    Goals Addressed             This Visit's Progress    Kristin Flores, daughter of client is concerned about memory issues of client and client completion of ADLs       Interventions  Kristin Flores spoke via phone today with Kristin Flores, daughter and contact for client. Kristin Flores is concerned over memory decline of patient. Kristin Flores reported that client had visit with neurologist recently. Kristin Flores said Family had provided notes to neurology office regarding client behavior and memory issues. Kristin Flores mentioned to Kristin Flores that MMSE of client had been 29/30 while at neurologist office.  However, Kristin Flores is concerned over memory decline of Client. Kristin Flores feels that client forgets to take her own medications occasionally and will sometimes forget to remind her Kristin Flores to take his prescribed medications. Kristin Flores has dementia. Kristin Flores also talked about client calling Kristin Flores multiple times daily , asking same questions. Kristin Flores had talked with Kristin Flores previously about in home support for client. Kristin Flores said she had not pursued this course since it was recommended to her that she pull back from such effort at this time to see if client could better manage home Needs for her and her Kristin Flores.  Kristin Flores did have list of about 5 home care agencies Kristin Flores talked briefly about Guardianship aspect for client. Kristin Flores also said it is hard for client to allow care providers , such as those with home health agency, to come to client home to help. Kristin Flores was  concerned about condition of home of client (that is, she thought home looked like that of a hoarder, client not being able to throw away items of no use) Kristin Flores and Kristin Flores spoke of options for client care. Kristin Flores is considering talking with PCP or NP at PCP office about a referral for a second opinion in neurology for client Kristin Flores spoke of psychological testing for client. Kristin Flores feels that further psychological testing for client would be beneficial for client and also help Kristin Flores, daughter, know the best way to care for the needs of client. Encouraged Kristin Flores to call Kristin Flores as needed to discuss SW needs of client at [phone].    SDOH assessments and interventions completed:  Yes  SDOH Interventions Today    Flowsheet Row Most Recent Value  SDOH Interventions   Depression Interventions/Treatment  Medication  Physical Activity Interventions Other (Comments)  [client has decreased activity]  Stress Interventions Other (Comment)  [client has stress related to caring for needs of her Kristin Flores. client has stress related to meeting her own daily care needs]        Care Coordination Interventions:  Yes, provided   Interventions Today    Flowsheet Row Most Recent Value  Chronic Disease   Chronic disease during today's visit Other  [spoke with Kristin Flores, daughter of client, about client needs and memory issues]  General Interventions   General Interventions Discussed/Reviewed General Interventions Discussed, [employer]  [discussed program support]  Exercise Interventions   Exercise Discussed/Reviewed Physical Activity  [client may have decreased physical activity.  Family has alluded to fact that client spends a lot of time on the couch or in the recliner daily]  Physical Activity Discussed/Reviewed Physical Activity Discussed  Education Interventions   Education Provided Provided Education  Provided Verbal Education On [employer]  [discussed client visit with  neurologist (NP) and results of that visit with NP at neourologist office]  [place] Discussed/Reviewed Anxiety, Coping Strategies  [discussed memory issues of client. Discussed stress and anxiety issues of client. Kristin Flores , daughter , said sometimes client forgets to take her own medication and sometimes forgets to have her Kristin Flores take some of his medications]  Pharmacy Interventions   Pharmacy Dicussed/Reviewed Pharmacy Topics Discussed        Follow up plan: Follow up call scheduled for 01/11/23 at 10:30 AM    Encounter Outcome:  Pt. Visit Completed   Norva Riffle.Devine Klingel MSW, [place] [employer] representative [facility] Care Management [phone]

## 2022-12-21 NOTE — Patient Instructions (Signed)
Visit Information  Thank you for taking time to visit with me today. Please don't hesitate to contact me if I can be of assistance to you.   Following are the goals we discussed today:   Goals Addressed             This Visit's Progress    [name], daughter of Kristin Flores is concerned about memory issues of Kristin Flores and Kristin Flores completion of ADLs       Interventions  LCSW spoke via phone today with [name], daughter and contact for Kristin Flores. [name] is concerned over memory decline of Kristin Flores. [name] reported that Kristin Flores had visit with neurologist recently. [name] said Family had provided notes to neurology office regarding Kristin Flores behavior and memory issues. LCSW mentioned to [name] that MMSE of Kristin Flores had been 57/30 while at neurologist office.  However, [name] is concerned over memory decline of Kristin Flores. [name] feels that Kristin Flores forgets to take her own medications occasionally and will sometimes forget to remind her spouse to take his prescribed medications. Spouse has dementia. [name] and LCSW also talked about Kristin Flores calling [name] multiple times daily , asking same questions. LCSW had talked with [name] previously about in home support for Kristin Flores. [name] said she had not pursued this course since it was recommended to her that she pull back from such effort at this time to see if Kristin Flores could better manage home Needs for her and her spouse.  [name] did have list of about 5 home care agencies [name] and LCSW talked briefly about Guardianship aspect for Kristin Flores. [name] also said it is hard for Kristin Flores to allow care providers , such as those with home health agency, to come to Kristin Flores home to help. [name] was concerned about condition of home of Kristin Flores (that is, she thought home looked like that of a hoarder, Kristin Flores not being able to throw away items of no use) LCSW and [name] spoke of options for Kristin Flores care. [name] is considering talking with PCP or NP at PCP office about a referral for a second opinion in  neurology for Kristin Flores [name] and LCSW spoke of psychological testing for Kristin Flores. [name] feels that further psychological testing for Kristin Flores would be beneficial for Kristin Flores and also help [name], daughter, know the best way to care for the needs of Kristin Flores. Encouraged [name] to call LCSW as needed to discuss SW needs of Kristin Flores at [phone].    Our next appointment is by telephone on 01/11/23 at 10:30 AM   Please call the care guide team at [phone] if you need to cancel or reschedule your appointment.   If you are experiencing a Mental Health or [place] or need someone to talk to, please go to [facility] [address] [place] [phone])   The Kristin Flores / [name], daughter of Kristin Flores, verbalized understanding of instructions, educational materials, and care plan provided today and DECLINED offer to receive copy of Kristin Flores instructions, educational materials, and care plan.   The Kristin Flores / [name], daughter of Kristin Flores, has been provided with contact information for the care management team and has been advised to call with any health related questions or concerns.    Norva Riffle.Freida Nebel MSW, [place] Holiday representative [facility] Care Management [phone]

## 2023-01-11 ENCOUNTER — Ambulatory Visit: Payer: Self-pay | Admitting: Licensed Clinical Social Worker

## 2023-01-11 NOTE — Patient Instructions (Signed)
Visit Information  Thank you for taking time to visit with me today. Please don't hesitate to contact me if I can be of assistance to you.   Following are the goals we discussed today:   Goals Addressed             This Visit's Progress    Kristin Flores, daughter of client is concerned about memory issues of client and client completion of ADLs       Interventions   LCSW talked today via phone with Kristin Flores, daughter of patient, about client needs [name] and LCSW spoke of counseling support for client. Client is on waiting list for counseling support at [facility]  Discussed home environment of client. Discussed needs of client's spouse. Discussed client support with PCP and PCP office. [name] is concerned over memory issues of client. Client has had one appointment with a neurologist Discussed role of APS in Social Services Discussed idea of Guardianship application for client. [name] said she has thought about this process Discussed client support with client's brother and sister Discussed [facility] behavioral health phone counseling support for client (as counseling possibility) Encouraged Kristin Flores to call LCSW as needed to discuss needs of client at [phone].        Our next appointment is by telephone on 03/07/23 at 1:00 PM   Please call the care guide team at [phone] if you need to cancel or reschedule your appointment.   If you are experiencing a Mental Health or Behavioral Health Crisis or need someone to talk to, please go to [facility] [address] [place] [phone])   The patient / Kristin Flores, daughter, verbalized understanding of instructions, educational materials, and care plan provided today and DECLINED offer to receive copy of patient instructions, educational materials, and care plan.   The patient / Kristin Flores, daughter, has been provided with contact information for the care management team and has been  advised to call with any health related questions or concerns.   Kelton Pillar.Eydie Wormley MSW, LCSW Licensed Visual merchandiser [facility] Care Management [phone]

## 2023-01-11 NOTE — Patient Outreach (Signed)
  Care Coordination   Follow Up Visit Note   01/11/2023 Name: Kristin Flores MRN: [id number] DOB: [birth year]  Kristin Flores is a 77 y.o. year old female who sees Kristin Fusi, MD for primary care. I spoke with  Kristin Flores / Kristin Flores, daughter of client via phone today.  What matters to the patients health and wellness today?  Client is having some memory challenges.    Goals Addressed             This Visit's Progress    Kristin Flores, daughter of client is concerned about memory issues of client and client completion of ADLs       Interventions   LCSW talked today via phone with Kristin Flores, daughter of patient, about client needs Kristin Flores and LCSW spoke of counseling support for client. Client is on waiting list for counseling support at [facility]  Discussed home environment of client. Discussed needs of client's spouse. Discussed client support with PCP and PCP office. Kristin Flores is concerned over memory issues of client. Client has had one appointment with a neurologist Discussed role of APS in Social Services Discussed idea of Guardianship application for client. Kristin Flores said she has thought about this process Discussed client support with client's brother and sister Discussed [facility] behavioral health phone counseling support for client (as counseling possibility) Encouraged Kristin Flores to call LCSW as needed to discuss needs of client at [phone].          SDOH assessments and interventions completed:  Yes    Care Coordination Interventions:  Yes, provided   Interventions Today    Flowsheet Row Most Recent Value  Chronic Disease   Chronic disease during today's visit Other  [spoke with Kristin Flores, daughter, about client needs]  General Interventions   General Interventions Discussed/Reviewed General Interventions Discussed, [employer] program support. discussed counseling support for client]  Education  Interventions   Education Provided Provided Education  Provided Verbal Education On [employer], Mental Health/Coping with Illness  [client is on waiting list for counseling with [facility]]  Mental Health Interventions   Mental Health Discussed/Reviewed Anxiety, Coping Strategies  [client is sometimes forgetful. Does not always take medications as prescribed]  Pharmacy Interventions   Pharmacy Dicussed/Reviewed Pharmacy Topics Discussed  Safety Interventions   Safety Discussed/Reviewed Home Safety        Follow up plan: Follow up call scheduled for 03/07/23 at 1:00 PM     Encounter Outcome:  Pt. Visit Completed   Kristin Flores.Kristin Flores MSW, LCSW Licensed Visual merchandiser [facility] Care Management [phone]

## 2023-01-19 ENCOUNTER — Ambulatory Visit: Payer: Self-pay | Admitting: Licensed Clinical Social Worker

## 2023-01-19 NOTE — Patient Outreach (Signed)
Care Coordination   Follow Up Visit Note   01/19/2023 Name: Kristin Flores MRN: [id number] DOB: December 02, 1945  Kristin Flores is a 77 y.o. year old female who sees Paulina Fusi, MD for primary care. I spoke with  Shelton Silvas Swager / Sim Boast, brother in law, via phone today about client needs.  What matters to the patients health and wellness today?  Client has memory issues. Client may need some help with ADLs     Goals Addressed             This Visit's Progress    client has memory issues. Client may need some help with ADLs       Interventions:  LCSW spoke via phone today with Sim Boast, brother in law of client, about client needs Gerlene Burdock said client has memory issues. He said she may forget to take some of her prescribed medications. He said she may forget to give her spouse some of his prescribed medications. Richard said he prepares medication pill box for client and spouse each week. Richard and [employer] discussed home environment of client. Richard said client home was cluttered, needed some cleaning Discussed ambulation of client. Richard said client may be at risk for falls. Client walks slowly . Client uses a cane to help with walking Discussed counseling support. Client is on waiting list at [facility] for mental health support Reviewed client ADLs. Richard said client has reduced hygiene, may not bathe very often Reviewed meals of client. Richard said client is not eating regularly.  Reviewed client support. Client has support of her daughter, of her sister and of her brother in law Discussed in home care support possibly for client support Encouraged client or daughter of client or Sim Boast to call LCSW as needed for SW support for client at [phone]    SDOH assessments and interventions completed:  Yes  SDOH Interventions Today    Flowsheet Row Most Recent Value  SDOH Interventions   Depression Interventions/Treatment   Medication  Physical Activity Interventions Other (Comments)  [client has decreased activities.]  Stress Interventions Other (Comment)  [client has stress in caring for needs of her spouse]       Care Coordination Interventions:  Yes, provided    Interventions Today    Flowsheet Row Most Recent Value  Chronic Disease   Chronic disease during today's visit Other  [LCSW spoke via phone with Sim Boast, brother in law, about client needs]  General Interventions   General Interventions Discussed/Reviewed General Interventions Discussed, [employer] program support for client. discussed client being on waiting list at [facility] for mental health support]  Exercise Interventions   Exercise Discussed/Reviewed Physical Activity  [client may have decreased activity level. She and spouse go out to eat occasionally. Home is cluttered]  Physical Activity Discussed/Reviewed Physical Activity Discussed  Education Interventions   Education Provided Provided Education  Provided Verbal Education On Community Resources  [discussed daughter contacting [facility] to inquire about possible benefits for client.]  Mental Health Interventions   Mental Health Discussed/Reviewed Anxiety, Coping Strategies  [anxiety issues. client has decreased motivation. May forget to take some of her prescribed medications. May forget to give spouse some of his prescribed medications. Family is concerned over memory issues of client]  Nutrition Interventions   Nutrition Discussed/Reviewed Nutrition Discussed  Pharmacy Interventions   Pharmacy Dicussed/Reviewed Pharmacy Topics Discussed  Safety Interventions   Safety Discussed/Reviewed Fall Risk       Follow up  plan: LCSW to call [name] or Sim Boast as scheduled to discuss client needs   Encounter Outcome:  Pt. Visit Completed   Kelton Pillar.Cuyler Vandyken MSW, LCSW Licensed Visual merchandiser [facility] Care Management [phone]

## 2023-01-19 NOTE — Patient Instructions (Signed)
Visit Information  Thank you for taking time to visit with me today. Please don't hesitate to contact me if I can be of assistance to you.   Following are the goals we discussed today:   Goals Addressed             This Visit's Progress    client has memory issues. Client may need some help with ADLs       Interventions:  LCSW spoke via phone today with Kristin Flores, brother in law of client, about client needs Gerlene Burdock said client has memory issues. He said she may forget to take some of her prescribed medications. He said she may forget to give her spouse some of his prescribed medications. Kristin Flores said he prepares medication pill box for client and spouse each week. Kristin Flores and [employer] discussed home environment of client. Kristin Flores said client home was cluttered, needed some cleaning Discussed ambulation of client. Kristin Flores said client may be at risk for falls. Client walks slowly . Client uses a cane to help with walking Discussed counseling support. Client is on waiting list at [facility] for mental health support Reviewed client ADLs. Kristin Flores said client has reduced hygiene, may not bathe very often Reviewed meals of client. Kristin Flores said client is not eating regularly.  Reviewed client support. Client has support of her daughter, of her sister and of her brother in law Discussed in home care support possibly for client support Encouraged client or daughter of client or Kristin Flores to call LCSW as needed for SW support for client at [phone]    LCSW to call [name] or Kristin Flores as scheduled to discuss client needs  Please call the care guide team at [phone] if you need to cancel or reschedule your appointment.   If you are experiencing a Mental Health or Behavioral Health Crisis or need someone to talk to, please go to [facility] [address], [place] [phone])   The patient / Kristin Flores, brother in law,  verbalized understanding of instructions, educational materials, and care plan provided today and DECLINED offer to receive copy of patient instructions, educational materials, and care plan.   The patient / Kristin Flores, brother in law, has been provided with contact information for the care management team and has been advised to call with any health related questions or concerns.   Kelton Pillar.Kristin Barcus MSW, LCSW Licensed Visual merchandiser [facility] Care Management [phone]

## 2023-03-01 ENCOUNTER — Telehealth: Payer: Self-pay | Admitting: *Deleted

## 2023-03-01 NOTE — Progress Notes (Signed)
Pt daughter called to cx upcoming 6/3 appt. Pt daughter declines to reschedule at this time - services not needed. Daughter given contact info if needed in the future.

## 2023-03-07 ENCOUNTER — Encounter: Payer: Medicare Other | Admitting: Licensed Clinical Social Worker

## 2023-05-13 DIAGNOSIS — E785 Hyperlipidemia, unspecified: Secondary | ICD-10-CM | POA: Diagnosis not present

## 2023-05-13 DIAGNOSIS — I10 Essential (primary) hypertension: Secondary | ICD-10-CM | POA: Diagnosis not present

## 2023-05-13 DIAGNOSIS — J449 Chronic obstructive pulmonary disease, unspecified: Secondary | ICD-10-CM | POA: Diagnosis not present

## 2023-05-13 DIAGNOSIS — K219 Gastro-esophageal reflux disease without esophagitis: Secondary | ICD-10-CM | POA: Diagnosis not present

## 2023-05-13 DIAGNOSIS — M810 Age-related osteoporosis without current pathological fracture: Secondary | ICD-10-CM | POA: Diagnosis not present

## 2023-05-13 DIAGNOSIS — E1169 Type 2 diabetes mellitus with other specified complication: Secondary | ICD-10-CM | POA: Diagnosis not present

## 2023-05-13 DIAGNOSIS — M159 Polyosteoarthritis, unspecified: Secondary | ICD-10-CM | POA: Diagnosis not present

## 2023-05-13 DIAGNOSIS — R5382 Chronic fatigue, unspecified: Secondary | ICD-10-CM | POA: Diagnosis not present

## 2023-05-18 DIAGNOSIS — D649 Anemia, unspecified: Secondary | ICD-10-CM | POA: Diagnosis not present

## 2023-05-18 DIAGNOSIS — I10 Essential (primary) hypertension: Secondary | ICD-10-CM | POA: Diagnosis not present

## 2023-05-18 DIAGNOSIS — N1831 Chronic kidney disease, stage 3a: Secondary | ICD-10-CM | POA: Diagnosis not present

## 2023-05-18 DIAGNOSIS — M25552 Pain in left hip: Secondary | ICD-10-CM | POA: Diagnosis not present

## 2023-05-18 DIAGNOSIS — J449 Chronic obstructive pulmonary disease, unspecified: Secondary | ICD-10-CM | POA: Diagnosis not present

## 2023-05-18 DIAGNOSIS — E1169 Type 2 diabetes mellitus with other specified complication: Secondary | ICD-10-CM | POA: Diagnosis not present

## 2023-05-18 DIAGNOSIS — E785 Hyperlipidemia, unspecified: Secondary | ICD-10-CM | POA: Diagnosis not present

## 2023-05-18 DIAGNOSIS — N390 Urinary tract infection, site not specified: Secondary | ICD-10-CM | POA: Diagnosis not present

## 2023-05-23 ENCOUNTER — Other Ambulatory Visit: Payer: Self-pay | Admitting: Physician Assistant

## 2023-05-31 DIAGNOSIS — M810 Age-related osteoporosis without current pathological fracture: Secondary | ICD-10-CM | POA: Diagnosis not present

## 2023-05-31 DIAGNOSIS — I10 Essential (primary) hypertension: Secondary | ICD-10-CM | POA: Diagnosis not present

## 2023-05-31 DIAGNOSIS — D509 Iron deficiency anemia, unspecified: Secondary | ICD-10-CM | POA: Diagnosis not present

## 2023-05-31 DIAGNOSIS — K219 Gastro-esophageal reflux disease without esophagitis: Secondary | ICD-10-CM | POA: Diagnosis not present

## 2023-05-31 DIAGNOSIS — E785 Hyperlipidemia, unspecified: Secondary | ICD-10-CM | POA: Diagnosis not present

## 2023-05-31 DIAGNOSIS — J449 Chronic obstructive pulmonary disease, unspecified: Secondary | ICD-10-CM | POA: Diagnosis not present

## 2023-05-31 DIAGNOSIS — N1831 Chronic kidney disease, stage 3a: Secondary | ICD-10-CM | POA: Diagnosis not present

## 2023-05-31 DIAGNOSIS — E1169 Type 2 diabetes mellitus with other specified complication: Secondary | ICD-10-CM | POA: Diagnosis not present

## 2023-06-01 ENCOUNTER — Ambulatory Visit: Payer: Medicare Other | Admitting: Physician Assistant

## 2023-06-02 DIAGNOSIS — E119 Type 2 diabetes mellitus without complications: Secondary | ICD-10-CM | POA: Diagnosis not present

## 2023-06-02 DIAGNOSIS — N1831 Chronic kidney disease, stage 3a: Secondary | ICD-10-CM | POA: Diagnosis not present

## 2023-06-02 DIAGNOSIS — K5909 Other constipation: Secondary | ICD-10-CM | POA: Diagnosis not present

## 2023-06-02 DIAGNOSIS — I129 Hypertensive chronic kidney disease with stage 1 through stage 4 chronic kidney disease, or unspecified chronic kidney disease: Secondary | ICD-10-CM | POA: Diagnosis not present

## 2023-06-02 DIAGNOSIS — Z556 Problems related to health literacy: Secondary | ICD-10-CM | POA: Diagnosis not present

## 2023-06-02 DIAGNOSIS — K219 Gastro-esophageal reflux disease without esophagitis: Secondary | ICD-10-CM | POA: Diagnosis not present

## 2023-06-02 DIAGNOSIS — Z791 Long term (current) use of non-steroidal anti-inflammatories (NSAID): Secondary | ICD-10-CM | POA: Diagnosis not present

## 2023-06-02 DIAGNOSIS — J4489 Other specified chronic obstructive pulmonary disease: Secondary | ICD-10-CM | POA: Diagnosis not present

## 2023-06-02 DIAGNOSIS — D631 Anemia in chronic kidney disease: Secondary | ICD-10-CM | POA: Diagnosis not present

## 2023-06-02 DIAGNOSIS — E785 Hyperlipidemia, unspecified: Secondary | ICD-10-CM | POA: Diagnosis not present

## 2023-06-02 DIAGNOSIS — M1990 Unspecified osteoarthritis, unspecified site: Secondary | ICD-10-CM | POA: Diagnosis not present

## 2023-06-02 DIAGNOSIS — Z7984 Long term (current) use of oral hypoglycemic drugs: Secondary | ICD-10-CM | POA: Diagnosis not present

## 2023-06-02 DIAGNOSIS — M810 Age-related osteoporosis without current pathological fracture: Secondary | ICD-10-CM | POA: Diagnosis not present

## 2023-06-02 DIAGNOSIS — Z87440 Personal history of urinary (tract) infections: Secondary | ICD-10-CM | POA: Diagnosis not present

## 2023-06-08 ENCOUNTER — Ambulatory Visit: Payer: Medicare Other | Admitting: Neurology

## 2023-06-08 ENCOUNTER — Encounter: Payer: Self-pay | Admitting: Neurology

## 2023-06-08 VITALS — BP 128/76 | HR 64 | Ht 65.0 in | Wt 187.4 lb

## 2023-06-08 DIAGNOSIS — F01B18 Vascular dementia, moderate, with other behavioral disturbance: Secondary | ICD-10-CM

## 2023-06-08 MED ORDER — DONEPEZIL HCL 10 MG PO TABS: ORAL_TABLET | ORAL | 3 refills | Status: DC

## 2023-06-08 NOTE — Progress Notes (Signed)
NEUROLOGY FOLLOW UP OFFICE NOTE  Rylie Mcvaugh [id number] [birth year]  HISTORY OF PRESENT ILLNESS: I had the pleasure of seeing Olita Comeaux in follow-up in the neurology clinic on 06/08/2023.  The patient was last seen 7 months ago by Memory Disorders PA Marlowe Kays. She is accompanied by her sister Linus Salmons, her daughter [name] is on speakerphone to help supplement the history today.  Records and images were personally reviewed where available. She underwent Neuropsychological testing at [facility] in July 2024, neuropsychological profile characterized by impairments in learning and recall of verbal and visual information, aspects of working memory (digits backward), speeded visual scanning and set shifting, response inhibition, and lexical and semantic verbal fluency. In the context of reported need for considerable assistance with instrument activities of daily living (iADLs) reported by Mr. and Mrs. 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 Profile is not consistent with Alzheimer's disease, etiology more consistent with a vascular process. They also reported a history of significant depression and anxiety.   They bring a list of observations. Hygiene is deteriorating. She wears the same clothes daily and says she showers regularly but family disagrees. Memory recall has become a serious issues, she did not recall a family visit 2 hours after. She cannot remember to take her own medications or giver her husband his medications unless [name] calls twice. [name] has to FaceTime them and visualize them taking medications, otherwise they say they had taken them. She has become less willing to drive, she has gotten turned around per Twyla. Willingness to eat has  diminished, she does not cook. They did not like Meals on Wheels, so [name] orders groceries and has to FaceTime them to walk them through putting things in the oven, turning it on and off. She then forgot to eat the food and ended up eating a sandwich. She kept asking Twyla for bread when there were 3 loaves at home. She has a hard time remembering where she puts things of importance. [name] manages finances. She has difficulty processing what is said without multiple repetitive reminders, she has difficulty understanding basic instructions such as when they are called about the medications, it takes 20 minutes because she forgets what she went to get. She has difficulty putting steps together, getting overwhelmed easily. On her last visit with Marlowe Kays, she was supposed to be on Donepezil, however it is not on her current medication list. They do not recall any side effects.   She denies any headaches, dizziness, vision changes, no recent falls. She has some tingling and numbness on her right knee since replacement. She denies trouble swallowing, [name] reminds her she uses a straw and has to cut food because she gets strangled. She says "did I tell you that?" She thinks she is sleeping fine, Twyla reminds her she told her she was up and down at night. She naps once in a while. Twyla reports she is able to do the laundry, use the dishwasher and coffee pot. She feels her mood is alright, but states her husband irritates her. They note he gives her a hard time. No paranoia or hallucinations.   Diagnostic Data: Neuropsychological testing at [facility] in July 2024, neuropsychological profile characterized by impairments in learning and recall of verbal and visual information, aspects of working memory (digits backward), speeded visual  scanning and set shifting, response inhibition, and lexical and semantic verbal fluency. In the context of reported need for considerable assistance with instrument activities  of daily living (iADLs) reported by Mr. and Mrs. Youse, as well as the presence of cognitive difficulties impacting acquisition and retrieval of new information, visual scanning and set shifting, receptive language, and difficulties with response inhibition, Mrs. Reiner meets criteria for a Major Neurocognitive Disorder. Profile is not consistent with Alzheimer's disease, etiology more consistent with a vascular process. They also reported a history of significant depression and anxiety.   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 Patient and I have discussed the hazardous effects of continued opiate pain medication usage. Risks and benefits of above medications including but not limited to possibility of hyperalgesia, respiratory depression, sedation, and even death were discussed with the patient who expressed an understanding. Patient    Constipation    DDD (degenerative disc disease), lumbar 09/01/2021   Last Assessment & Plan:  Formatting of this note might be different from the original. 77 year old female seen today in routine follow-up for chronic lower back pain without true radicular symptoms.  Her sister is present with her today as she normally is.    Her most recent lumbar spine MRI was completed in 2016.  Unfortunately, there is no report or imaging to review in her EMR.  She did complet   Diabetes mellitus  without complication (HCC)    borderline   Diverticulitis    Edema    Fibromyalgia    Fibromyalgia    GERD (gastroesophageal reflux disease)    Hyperlipidemia    Hypertension    Lumbar spondylolysis 09/01/2021   Last Assessment & Plan:  Formatting of this note might be different from the original. See lumbar DDD plan   Lumbar spondylosis 03/24/2022   Malaise and fatigue    Neuropathy 03/24/2022   Obesity    Osteoarthritis    Osteoporosis, postmenopausal    Other fatigue 02/16/2017   Pain in joint, lower leg 12/25/2015   Pain medication agreement 09/01/2021   Last Assessment & Plan:  Formatting of this note might be different from the original. Agreement up-to-date.   UDS to be collected today in compliance with clinic policies and procedures.   Patient did not display any signs of abuse or diversion and has been checked on the [facility] Controlled Substance website.   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     alendronate (FOSAMAX) 70 MG tablet Take 70 mg by mouth once a week.     amLODipine (NORVASC) 5 MG tablet Take 5 mg by mouth daily.     benazepril-hydrochlorthiazide (LOTENSIN HCT) 20-25 MG tablet Take 1 tablet by mouth daily.     celecoxib (CELEBREX) 200 MG capsule Take 200 mg by mouth daily.     cyclobenzaprine (FLEXERIL) 10 MG tablet Take 10 mg by mouth daily.      ergocalciferol (VITAMIN D2) 1.25 MG (50000 UT) capsule Take 50,000 Units by mouth once a week.      escitalopram (LEXAPRO) 10 MG tablet Take 10 mg by mouth daily.     ketoconazole (NIZORAL) 2 % cream Apply 1 application topically 2 (two) times daily.     metFORMIN (GLUCOPHAGE-XR) 500 MG 24 hr tablet Take 500 mg by mouth 2 (two) times daily.      mometasone-formoterol (DULERA) 200-5 MCG/ACT AERO Inhale 2 puffs into the lungs 2 (two) times daily.     rosuvastatin (CRESTOR) 10 MG tablet Take 10 mg by mouth every evening.     traMADol (ULTRAM) 50 MG tablet Take 50 mg by mouth every 8 (eight) hours as needed for pain.     No current facility-administered medications on file prior to visit.    ALLERGIES: Allergies  Allergen Reactions   Codeine Nausea And Vomiting    "doesn't agree with me"    FAMILY HISTORY: Family History  Problem Relation Age of Onset   Lymphoma Mother    Lung cancer Father    Emphysema Father    Lung cancer Sister    COPD Brother    Heart Problems Brother    Emphysema Brother    Hypertension Brother    Stroke Maternal Grandfather    Heart attack Paternal Grandfather     SOCIAL HISTORY: Social History   Socioeconomic History   Marital status: Married    Spouse name: Not on file   Number of children: 3   Years of education: 13   Highest education level: Not on file  Occupational History   Not on file  Tobacco Use   Smoking status: Never    Passive exposure: Past   Smokeless tobacco: Never  Vaping Use   Vaping status: Never Used  Substance and Sexual Activity   Alcohol use: No   Drug use: No   Sexual activity: Not on file  Other Topics Concern   Not on file  Social History Narrative   Right handed   Drinks caffeine   One flooor   Retired   One floor home   Husband lives with her in home   Social Determinants of Health   Financial Resource Strain: Not on file  Food Insecurity: No Food Insecurity (04/16/2021)   [facility]   Hunger Vital Sign    Worried About Running Out of Food in the Last Year: Never true     Ran Out of Food in the Last Year: Never true  Transportation Needs: No Transportation Needs (04/16/2021)   [facility]   PRAPARE - Transportation    Lack of Transportation (Medical): No    Lack of Transportation (Non-Medical): No  Physical Activity: Inactive (01/19/2023)   Exercise Vital Sign    Days of Exercise per Week: 0 days  Minutes of Exercise per Session: 0 min  Stress: Stress Concern Present (01/19/2023)   [employer] of Occupational Health - Occupational Stress Questionnaire    Feeling of Stress : Rather much  Social Connections: Not on file  Intimate Partner Violence: Unknown (07/14/2022)   [facility]   Intimate Partner Violence    Fear of Current or Ex-Partner: Not on file     PHYSICAL EXAM: Vitals:   06/08/23 0858  BP: 128/76  Pulse: 64  SpO2: 99%   General: No acute distress Head:  Normocephalic/atraumatic Skin/Extremities: No rash, no edema Neurological Exam: alert and awake. Does not know month/year. No aphasia or dysarthria. Fund of knowledge is reduced.  Recent and remote memory are impaired. Attention and concentration are normal. Cranial nerves: Pupils equal, round. Extraocular movements intact with no nystagmus. Visual fields full.  No facial asymmetry.  Motor: Bulk and tone normal, muscle strength 5/5 throughout with no pronator drift.   Finger to nose testing intact.  Gait slow and cautious with cane, no ataxia. No tremors.    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 MRI brain shows mild to moderate chronic microvascular disease. She is having increasing difficulties with complex tasks. We discussed restarting Donepezil 10mg  1/2 tablet daily for 2 weeks, then increase to 1 tablet daily. Monitor for side  effects. Discussed home safety and needing more help at home, they would like to stay home as long as able. Her husband has been resisting more help at home. Discussed no further driving, if she would like to drive, she needs to have a formal driving evaluation first. Continue close supervision. Follow-up in 6 months, call for any changes.    Thank you for allowing me to participate in her care.  Please do not hesitate to call for any questions or concerns.    Patrcia Dolly, M.D.   CC: Dr. Nedra Hai

## 2023-06-08 NOTE — Patient Instructions (Signed)
Good to see you.  Start Donepezil 10mg : take 1/2 tablet daily for 2 weeks, then increase to 1 tablet daily  2. No further driving recommended  3. Recommend having increased supervision at home  4. Follow-up in 6 months, call for any changes   FALL PRECAUTIONS: Be cautious when walking. Scan the area for obstacles that may increase the risk of trips and falls. When getting up in the mornings, sit up at the edge of the bed for a few minutes before getting out of bed. Consider elevating the bed at the head end to avoid drop of blood pressure when getting up. Walk always in a well-lit room (use night lights in the walls). Avoid area rugs or power cords from appliances in the middle of the walkways. Use a walker or a cane if necessary and consider physical therapy for balance exercise. Get your eyesight checked regularly.  FINANCIAL OVERSIGHT: Supervision, especially oversight when making financial decisions or transactions is also recommended.  HOME SAFETY: Consider the safety of the kitchen when operating appliances like stoves, microwave oven, and blender. Consider having supervision and share cooking responsibilities until no longer able to participate in those. Accidents with firearms and other hazards in the house should be identified and addressed as well.  DRIVING: Regarding driving, in patients with progressive memory problems, driving will be impaired. We advise to have someone else do the driving if trouble finding directions or if minor accidents are reported. Independent driving assessment is available to determine safety of driving.  ABILITY TO BE LEFT ALONE: If patient is unable to contact 911 operator, consider using LifeLine, or when the need is there, arrange for someone to stay with patients. Smoking is a fire hazard, consider supervision or cessation. Risk of wandering should be assessed by caregiver and if detected at any point, supervision and safe proof recommendations should be  instituted.  MEDICATION SUPERVISION: Inability to self-administer medication needs to be constantly addressed. Implement a mechanism to ensure safe administration of the medications.  RECOMMENDATIONS FOR ALL PATIENTS WITH MEMORY PROBLEMS: 1. Continue to exercise (Recommend 30 minutes of walking everyday, or 3 hours every week) 2. Increase social interactions - continue going to [place] and enjoy social gatherings with friends and family 3. Eat healthy, avoid fried foods and eat more fruits and vegetables 4. Maintain adequate blood pressure, blood sugar, and blood cholesterol level. Reducing the risk of stroke and cardiovascular disease also helps promoting better memory. 5. Avoid stressful situations. Live a simple life and avoid aggravations. Organize your time and prepare for the next day in anticipation. 6. Sleep well, avoid any interruptions of sleep and avoid any distractions in the bedroom that may interfere with adequate sleep quality 7. Avoid sugar, avoid sweets as there is a strong link between excessive sugar intake, diabetes, and cognitive impairment The Mediterranean diet has been shown to help patients reduce the risk of progressive memory disorders and reduces cardiovascular risk. This includes eating fish, eat fruits and green leafy vegetables, nuts like almonds and hazelnuts, walnuts, and also use olive oil. Avoid fast foods and fried foods as much as possible. Avoid sweets and sugar as sugar use has been linked to worsening of memory function.  There is always a concern of gradual progression of memory problems. If this is the case, then we may need to adjust level of care according to patient needs. Support, both to the patient and caregiver, should then be put into place.

## 2023-06-09 DIAGNOSIS — Z791 Long term (current) use of non-steroidal anti-inflammatories (NSAID): Secondary | ICD-10-CM | POA: Diagnosis not present

## 2023-06-09 DIAGNOSIS — K219 Gastro-esophageal reflux disease without esophagitis: Secondary | ICD-10-CM | POA: Diagnosis not present

## 2023-06-09 DIAGNOSIS — Z556 Problems related to health literacy: Secondary | ICD-10-CM | POA: Diagnosis not present

## 2023-06-09 DIAGNOSIS — M810 Age-related osteoporosis without current pathological fracture: Secondary | ICD-10-CM | POA: Diagnosis not present

## 2023-06-09 DIAGNOSIS — Z7984 Long term (current) use of oral hypoglycemic drugs: Secondary | ICD-10-CM | POA: Diagnosis not present

## 2023-06-09 DIAGNOSIS — K5909 Other constipation: Secondary | ICD-10-CM | POA: Diagnosis not present

## 2023-06-09 DIAGNOSIS — I129 Hypertensive chronic kidney disease with stage 1 through stage 4 chronic kidney disease, or unspecified chronic kidney disease: Secondary | ICD-10-CM | POA: Diagnosis not present

## 2023-06-09 DIAGNOSIS — E119 Type 2 diabetes mellitus without complications: Secondary | ICD-10-CM | POA: Diagnosis not present

## 2023-06-09 DIAGNOSIS — M1990 Unspecified osteoarthritis, unspecified site: Secondary | ICD-10-CM | POA: Diagnosis not present

## 2023-06-09 DIAGNOSIS — E785 Hyperlipidemia, unspecified: Secondary | ICD-10-CM | POA: Diagnosis not present

## 2023-06-09 DIAGNOSIS — J4489 Other specified chronic obstructive pulmonary disease: Secondary | ICD-10-CM | POA: Diagnosis not present

## 2023-06-09 DIAGNOSIS — N1831 Chronic kidney disease, stage 3a: Secondary | ICD-10-CM | POA: Diagnosis not present

## 2023-06-09 DIAGNOSIS — D631 Anemia in chronic kidney disease: Secondary | ICD-10-CM | POA: Diagnosis not present

## 2023-06-09 DIAGNOSIS — Z87440 Personal history of urinary (tract) infections: Secondary | ICD-10-CM | POA: Diagnosis not present

## 2023-06-10 DIAGNOSIS — E119 Type 2 diabetes mellitus without complications: Secondary | ICD-10-CM | POA: Diagnosis not present

## 2023-06-10 DIAGNOSIS — Z556 Problems related to health literacy: Secondary | ICD-10-CM | POA: Diagnosis not present

## 2023-06-10 DIAGNOSIS — Z7984 Long term (current) use of oral hypoglycemic drugs: Secondary | ICD-10-CM | POA: Diagnosis not present

## 2023-06-10 DIAGNOSIS — K219 Gastro-esophageal reflux disease without esophagitis: Secondary | ICD-10-CM | POA: Diagnosis not present

## 2023-06-10 DIAGNOSIS — K5909 Other constipation: Secondary | ICD-10-CM | POA: Diagnosis not present

## 2023-06-10 DIAGNOSIS — I129 Hypertensive chronic kidney disease with stage 1 through stage 4 chronic kidney disease, or unspecified chronic kidney disease: Secondary | ICD-10-CM | POA: Diagnosis not present

## 2023-06-10 DIAGNOSIS — M810 Age-related osteoporosis without current pathological fracture: Secondary | ICD-10-CM | POA: Diagnosis not present

## 2023-06-10 DIAGNOSIS — Z791 Long term (current) use of non-steroidal anti-inflammatories (NSAID): Secondary | ICD-10-CM | POA: Diagnosis not present

## 2023-06-10 DIAGNOSIS — M1990 Unspecified osteoarthritis, unspecified site: Secondary | ICD-10-CM | POA: Diagnosis not present

## 2023-06-10 DIAGNOSIS — J4489 Other specified chronic obstructive pulmonary disease: Secondary | ICD-10-CM | POA: Diagnosis not present

## 2023-06-10 DIAGNOSIS — N1831 Chronic kidney disease, stage 3a: Secondary | ICD-10-CM | POA: Diagnosis not present

## 2023-06-10 DIAGNOSIS — D631 Anemia in chronic kidney disease: Secondary | ICD-10-CM | POA: Diagnosis not present

## 2023-06-10 DIAGNOSIS — Z87440 Personal history of urinary (tract) infections: Secondary | ICD-10-CM | POA: Diagnosis not present

## 2023-06-10 DIAGNOSIS — E785 Hyperlipidemia, unspecified: Secondary | ICD-10-CM | POA: Diagnosis not present

## 2023-06-14 DIAGNOSIS — E785 Hyperlipidemia, unspecified: Secondary | ICD-10-CM | POA: Diagnosis not present

## 2023-06-14 DIAGNOSIS — D631 Anemia in chronic kidney disease: Secondary | ICD-10-CM | POA: Diagnosis not present

## 2023-06-14 DIAGNOSIS — Z87440 Personal history of urinary (tract) infections: Secondary | ICD-10-CM | POA: Diagnosis not present

## 2023-06-14 DIAGNOSIS — I129 Hypertensive chronic kidney disease with stage 1 through stage 4 chronic kidney disease, or unspecified chronic kidney disease: Secondary | ICD-10-CM | POA: Diagnosis not present

## 2023-06-14 DIAGNOSIS — N1831 Chronic kidney disease, stage 3a: Secondary | ICD-10-CM | POA: Diagnosis not present

## 2023-06-14 DIAGNOSIS — M810 Age-related osteoporosis without current pathological fracture: Secondary | ICD-10-CM | POA: Diagnosis not present

## 2023-06-14 DIAGNOSIS — K5909 Other constipation: Secondary | ICD-10-CM | POA: Diagnosis not present

## 2023-06-14 DIAGNOSIS — Z791 Long term (current) use of non-steroidal anti-inflammatories (NSAID): Secondary | ICD-10-CM | POA: Diagnosis not present

## 2023-06-14 DIAGNOSIS — J4489 Other specified chronic obstructive pulmonary disease: Secondary | ICD-10-CM | POA: Diagnosis not present

## 2023-06-14 DIAGNOSIS — Z7984 Long term (current) use of oral hypoglycemic drugs: Secondary | ICD-10-CM | POA: Diagnosis not present

## 2023-06-14 DIAGNOSIS — E119 Type 2 diabetes mellitus without complications: Secondary | ICD-10-CM | POA: Diagnosis not present

## 2023-06-14 DIAGNOSIS — Z556 Problems related to health literacy: Secondary | ICD-10-CM | POA: Diagnosis not present

## 2023-06-14 DIAGNOSIS — K219 Gastro-esophageal reflux disease without esophagitis: Secondary | ICD-10-CM | POA: Diagnosis not present

## 2023-06-14 DIAGNOSIS — M1990 Unspecified osteoarthritis, unspecified site: Secondary | ICD-10-CM | POA: Diagnosis not present

## 2023-06-16 ENCOUNTER — Other Ambulatory Visit: Payer: Self-pay | Admitting: Physician Assistant

## 2023-06-16 ENCOUNTER — Other Ambulatory Visit: Payer: Self-pay | Admitting: Cardiology

## 2023-06-16 DIAGNOSIS — Z87440 Personal history of urinary (tract) infections: Secondary | ICD-10-CM | POA: Diagnosis not present

## 2023-06-16 DIAGNOSIS — Z7984 Long term (current) use of oral hypoglycemic drugs: Secondary | ICD-10-CM | POA: Diagnosis not present

## 2023-06-16 DIAGNOSIS — I129 Hypertensive chronic kidney disease with stage 1 through stage 4 chronic kidney disease, or unspecified chronic kidney disease: Secondary | ICD-10-CM | POA: Diagnosis not present

## 2023-06-16 DIAGNOSIS — Z791 Long term (current) use of non-steroidal anti-inflammatories (NSAID): Secondary | ICD-10-CM | POA: Diagnosis not present

## 2023-06-16 DIAGNOSIS — K219 Gastro-esophageal reflux disease without esophagitis: Secondary | ICD-10-CM | POA: Diagnosis not present

## 2023-06-16 DIAGNOSIS — M1990 Unspecified osteoarthritis, unspecified site: Secondary | ICD-10-CM | POA: Diagnosis not present

## 2023-06-16 DIAGNOSIS — D631 Anemia in chronic kidney disease: Secondary | ICD-10-CM | POA: Diagnosis not present

## 2023-06-16 DIAGNOSIS — Z556 Problems related to health literacy: Secondary | ICD-10-CM | POA: Diagnosis not present

## 2023-06-16 DIAGNOSIS — E785 Hyperlipidemia, unspecified: Secondary | ICD-10-CM | POA: Diagnosis not present

## 2023-06-16 DIAGNOSIS — N1831 Chronic kidney disease, stage 3a: Secondary | ICD-10-CM | POA: Diagnosis not present

## 2023-06-16 DIAGNOSIS — M810 Age-related osteoporosis without current pathological fracture: Secondary | ICD-10-CM | POA: Diagnosis not present

## 2023-06-16 DIAGNOSIS — E119 Type 2 diabetes mellitus without complications: Secondary | ICD-10-CM | POA: Diagnosis not present

## 2023-06-16 DIAGNOSIS — K5909 Other constipation: Secondary | ICD-10-CM | POA: Diagnosis not present

## 2023-06-16 DIAGNOSIS — J4489 Other specified chronic obstructive pulmonary disease: Secondary | ICD-10-CM | POA: Diagnosis not present

## 2023-06-21 DIAGNOSIS — Z7984 Long term (current) use of oral hypoglycemic drugs: Secondary | ICD-10-CM | POA: Diagnosis not present

## 2023-06-21 DIAGNOSIS — N1831 Chronic kidney disease, stage 3a: Secondary | ICD-10-CM | POA: Diagnosis not present

## 2023-06-21 DIAGNOSIS — E785 Hyperlipidemia, unspecified: Secondary | ICD-10-CM | POA: Diagnosis not present

## 2023-06-21 DIAGNOSIS — I129 Hypertensive chronic kidney disease with stage 1 through stage 4 chronic kidney disease, or unspecified chronic kidney disease: Secondary | ICD-10-CM | POA: Diagnosis not present

## 2023-06-21 DIAGNOSIS — D631 Anemia in chronic kidney disease: Secondary | ICD-10-CM | POA: Diagnosis not present

## 2023-06-21 DIAGNOSIS — K5909 Other constipation: Secondary | ICD-10-CM | POA: Diagnosis not present

## 2023-06-21 DIAGNOSIS — E119 Type 2 diabetes mellitus without complications: Secondary | ICD-10-CM | POA: Diagnosis not present

## 2023-06-21 DIAGNOSIS — M1990 Unspecified osteoarthritis, unspecified site: Secondary | ICD-10-CM | POA: Diagnosis not present

## 2023-06-21 DIAGNOSIS — K219 Gastro-esophageal reflux disease without esophagitis: Secondary | ICD-10-CM | POA: Diagnosis not present

## 2023-06-21 DIAGNOSIS — Z791 Long term (current) use of non-steroidal anti-inflammatories (NSAID): Secondary | ICD-10-CM | POA: Diagnosis not present

## 2023-06-21 DIAGNOSIS — M810 Age-related osteoporosis without current pathological fracture: Secondary | ICD-10-CM | POA: Diagnosis not present

## 2023-06-21 DIAGNOSIS — J4489 Other specified chronic obstructive pulmonary disease: Secondary | ICD-10-CM | POA: Diagnosis not present

## 2023-06-21 DIAGNOSIS — Z87440 Personal history of urinary (tract) infections: Secondary | ICD-10-CM | POA: Diagnosis not present

## 2023-06-21 DIAGNOSIS — Z556 Problems related to health literacy: Secondary | ICD-10-CM | POA: Diagnosis not present

## 2023-06-24 ENCOUNTER — Other Ambulatory Visit: Payer: Self-pay | Admitting: Cardiology

## 2023-06-28 DIAGNOSIS — Z556 Problems related to health literacy: Secondary | ICD-10-CM | POA: Diagnosis not present

## 2023-06-28 DIAGNOSIS — K5909 Other constipation: Secondary | ICD-10-CM | POA: Diagnosis not present

## 2023-06-28 DIAGNOSIS — N1831 Chronic kidney disease, stage 3a: Secondary | ICD-10-CM | POA: Diagnosis not present

## 2023-06-28 DIAGNOSIS — D631 Anemia in chronic kidney disease: Secondary | ICD-10-CM | POA: Diagnosis not present

## 2023-06-28 DIAGNOSIS — J4489 Other specified chronic obstructive pulmonary disease: Secondary | ICD-10-CM | POA: Diagnosis not present

## 2023-06-28 DIAGNOSIS — E119 Type 2 diabetes mellitus without complications: Secondary | ICD-10-CM | POA: Diagnosis not present

## 2023-06-28 DIAGNOSIS — I129 Hypertensive chronic kidney disease with stage 1 through stage 4 chronic kidney disease, or unspecified chronic kidney disease: Secondary | ICD-10-CM | POA: Diagnosis not present

## 2023-06-28 DIAGNOSIS — K219 Gastro-esophageal reflux disease without esophagitis: Secondary | ICD-10-CM | POA: Diagnosis not present

## 2023-06-28 DIAGNOSIS — M1990 Unspecified osteoarthritis, unspecified site: Secondary | ICD-10-CM | POA: Diagnosis not present

## 2023-06-28 DIAGNOSIS — Z791 Long term (current) use of non-steroidal anti-inflammatories (NSAID): Secondary | ICD-10-CM | POA: Diagnosis not present

## 2023-06-28 DIAGNOSIS — M810 Age-related osteoporosis without current pathological fracture: Secondary | ICD-10-CM | POA: Diagnosis not present

## 2023-06-28 DIAGNOSIS — Z7984 Long term (current) use of oral hypoglycemic drugs: Secondary | ICD-10-CM | POA: Diagnosis not present

## 2023-06-28 DIAGNOSIS — Z87440 Personal history of urinary (tract) infections: Secondary | ICD-10-CM | POA: Diagnosis not present

## 2023-06-28 DIAGNOSIS — E785 Hyperlipidemia, unspecified: Secondary | ICD-10-CM | POA: Diagnosis not present

## 2023-07-05 DIAGNOSIS — K219 Gastro-esophageal reflux disease without esophagitis: Secondary | ICD-10-CM | POA: Diagnosis not present

## 2023-07-05 DIAGNOSIS — Z7984 Long term (current) use of oral hypoglycemic drugs: Secondary | ICD-10-CM | POA: Diagnosis not present

## 2023-07-05 DIAGNOSIS — M810 Age-related osteoporosis without current pathological fracture: Secondary | ICD-10-CM | POA: Diagnosis not present

## 2023-07-05 DIAGNOSIS — D631 Anemia in chronic kidney disease: Secondary | ICD-10-CM | POA: Diagnosis not present

## 2023-07-05 DIAGNOSIS — K5909 Other constipation: Secondary | ICD-10-CM | POA: Diagnosis not present

## 2023-07-05 DIAGNOSIS — Z556 Problems related to health literacy: Secondary | ICD-10-CM | POA: Diagnosis not present

## 2023-07-05 DIAGNOSIS — E119 Type 2 diabetes mellitus without complications: Secondary | ICD-10-CM | POA: Diagnosis not present

## 2023-07-05 DIAGNOSIS — J4489 Other specified chronic obstructive pulmonary disease: Secondary | ICD-10-CM | POA: Diagnosis not present

## 2023-07-05 DIAGNOSIS — Z791 Long term (current) use of non-steroidal anti-inflammatories (NSAID): Secondary | ICD-10-CM | POA: Diagnosis not present

## 2023-07-05 DIAGNOSIS — Z87440 Personal history of urinary (tract) infections: Secondary | ICD-10-CM | POA: Diagnosis not present

## 2023-07-05 DIAGNOSIS — I129 Hypertensive chronic kidney disease with stage 1 through stage 4 chronic kidney disease, or unspecified chronic kidney disease: Secondary | ICD-10-CM | POA: Diagnosis not present

## 2023-07-05 DIAGNOSIS — E785 Hyperlipidemia, unspecified: Secondary | ICD-10-CM | POA: Diagnosis not present

## 2023-07-05 DIAGNOSIS — N1831 Chronic kidney disease, stage 3a: Secondary | ICD-10-CM | POA: Diagnosis not present

## 2023-07-05 DIAGNOSIS — M1990 Unspecified osteoarthritis, unspecified site: Secondary | ICD-10-CM | POA: Diagnosis not present

## 2023-07-13 DIAGNOSIS — M1990 Unspecified osteoarthritis, unspecified site: Secondary | ICD-10-CM | POA: Diagnosis not present

## 2023-07-13 DIAGNOSIS — M810 Age-related osteoporosis without current pathological fracture: Secondary | ICD-10-CM | POA: Diagnosis not present

## 2023-07-13 DIAGNOSIS — D631 Anemia in chronic kidney disease: Secondary | ICD-10-CM | POA: Diagnosis not present

## 2023-07-13 DIAGNOSIS — Z87440 Personal history of urinary (tract) infections: Secondary | ICD-10-CM | POA: Diagnosis not present

## 2023-07-13 DIAGNOSIS — Z7984 Long term (current) use of oral hypoglycemic drugs: Secondary | ICD-10-CM | POA: Diagnosis not present

## 2023-07-13 DIAGNOSIS — J4489 Other specified chronic obstructive pulmonary disease: Secondary | ICD-10-CM | POA: Diagnosis not present

## 2023-07-13 DIAGNOSIS — K5909 Other constipation: Secondary | ICD-10-CM | POA: Diagnosis not present

## 2023-07-13 DIAGNOSIS — E119 Type 2 diabetes mellitus without complications: Secondary | ICD-10-CM | POA: Diagnosis not present

## 2023-07-13 DIAGNOSIS — I129 Hypertensive chronic kidney disease with stage 1 through stage 4 chronic kidney disease, or unspecified chronic kidney disease: Secondary | ICD-10-CM | POA: Diagnosis not present

## 2023-07-13 DIAGNOSIS — N1831 Chronic kidney disease, stage 3a: Secondary | ICD-10-CM | POA: Diagnosis not present

## 2023-07-13 DIAGNOSIS — E785 Hyperlipidemia, unspecified: Secondary | ICD-10-CM | POA: Diagnosis not present

## 2023-07-13 DIAGNOSIS — Z556 Problems related to health literacy: Secondary | ICD-10-CM | POA: Diagnosis not present

## 2023-07-13 DIAGNOSIS — K219 Gastro-esophageal reflux disease without esophagitis: Secondary | ICD-10-CM | POA: Diagnosis not present

## 2023-07-13 DIAGNOSIS — Z791 Long term (current) use of non-steroidal anti-inflammatories (NSAID): Secondary | ICD-10-CM | POA: Diagnosis not present

## 2023-07-22 DIAGNOSIS — M810 Age-related osteoporosis without current pathological fracture: Secondary | ICD-10-CM | POA: Diagnosis not present

## 2023-07-22 DIAGNOSIS — Z791 Long term (current) use of non-steroidal anti-inflammatories (NSAID): Secondary | ICD-10-CM | POA: Diagnosis not present

## 2023-07-22 DIAGNOSIS — N1831 Chronic kidney disease, stage 3a: Secondary | ICD-10-CM | POA: Diagnosis not present

## 2023-07-22 DIAGNOSIS — Z87440 Personal history of urinary (tract) infections: Secondary | ICD-10-CM | POA: Diagnosis not present

## 2023-07-22 DIAGNOSIS — J4489 Other specified chronic obstructive pulmonary disease: Secondary | ICD-10-CM | POA: Diagnosis not present

## 2023-07-22 DIAGNOSIS — Z556 Problems related to health literacy: Secondary | ICD-10-CM | POA: Diagnosis not present

## 2023-07-22 DIAGNOSIS — E119 Type 2 diabetes mellitus without complications: Secondary | ICD-10-CM | POA: Diagnosis not present

## 2023-07-22 DIAGNOSIS — K5909 Other constipation: Secondary | ICD-10-CM | POA: Diagnosis not present

## 2023-07-22 DIAGNOSIS — K219 Gastro-esophageal reflux disease without esophagitis: Secondary | ICD-10-CM | POA: Diagnosis not present

## 2023-07-22 DIAGNOSIS — Z7984 Long term (current) use of oral hypoglycemic drugs: Secondary | ICD-10-CM | POA: Diagnosis not present

## 2023-07-22 DIAGNOSIS — M1990 Unspecified osteoarthritis, unspecified site: Secondary | ICD-10-CM | POA: Diagnosis not present

## 2023-07-22 DIAGNOSIS — I129 Hypertensive chronic kidney disease with stage 1 through stage 4 chronic kidney disease, or unspecified chronic kidney disease: Secondary | ICD-10-CM | POA: Diagnosis not present

## 2023-07-22 DIAGNOSIS — E785 Hyperlipidemia, unspecified: Secondary | ICD-10-CM | POA: Diagnosis not present

## 2023-07-22 DIAGNOSIS — D631 Anemia in chronic kidney disease: Secondary | ICD-10-CM | POA: Diagnosis not present

## 2023-07-28 DIAGNOSIS — E119 Type 2 diabetes mellitus without complications: Secondary | ICD-10-CM | POA: Diagnosis not present

## 2023-07-28 DIAGNOSIS — D631 Anemia in chronic kidney disease: Secondary | ICD-10-CM | POA: Diagnosis not present

## 2023-07-28 DIAGNOSIS — E785 Hyperlipidemia, unspecified: Secondary | ICD-10-CM | POA: Diagnosis not present

## 2023-07-28 DIAGNOSIS — K219 Gastro-esophageal reflux disease without esophagitis: Secondary | ICD-10-CM | POA: Diagnosis not present

## 2023-07-28 DIAGNOSIS — I129 Hypertensive chronic kidney disease with stage 1 through stage 4 chronic kidney disease, or unspecified chronic kidney disease: Secondary | ICD-10-CM | POA: Diagnosis not present

## 2023-07-28 DIAGNOSIS — Z791 Long term (current) use of non-steroidal anti-inflammatories (NSAID): Secondary | ICD-10-CM | POA: Diagnosis not present

## 2023-07-28 DIAGNOSIS — N1831 Chronic kidney disease, stage 3a: Secondary | ICD-10-CM | POA: Diagnosis not present

## 2023-07-28 DIAGNOSIS — M1990 Unspecified osteoarthritis, unspecified site: Secondary | ICD-10-CM | POA: Diagnosis not present

## 2023-07-28 DIAGNOSIS — J4489 Other specified chronic obstructive pulmonary disease: Secondary | ICD-10-CM | POA: Diagnosis not present

## 2023-07-28 DIAGNOSIS — Z87440 Personal history of urinary (tract) infections: Secondary | ICD-10-CM | POA: Diagnosis not present

## 2023-07-28 DIAGNOSIS — Z7984 Long term (current) use of oral hypoglycemic drugs: Secondary | ICD-10-CM | POA: Diagnosis not present

## 2023-07-28 DIAGNOSIS — M810 Age-related osteoporosis without current pathological fracture: Secondary | ICD-10-CM | POA: Diagnosis not present

## 2023-07-28 DIAGNOSIS — Z556 Problems related to health literacy: Secondary | ICD-10-CM | POA: Diagnosis not present

## 2023-07-28 DIAGNOSIS — K5909 Other constipation: Secondary | ICD-10-CM | POA: Diagnosis not present

## 2023-08-29 DIAGNOSIS — E1169 Type 2 diabetes mellitus with other specified complication: Secondary | ICD-10-CM | POA: Diagnosis not present

## 2023-08-29 DIAGNOSIS — K219 Gastro-esophageal reflux disease without esophagitis: Secondary | ICD-10-CM | POA: Diagnosis not present

## 2023-08-29 DIAGNOSIS — M810 Age-related osteoporosis without current pathological fracture: Secondary | ICD-10-CM | POA: Diagnosis not present

## 2023-08-29 DIAGNOSIS — J449 Chronic obstructive pulmonary disease, unspecified: Secondary | ICD-10-CM | POA: Diagnosis not present

## 2023-08-29 DIAGNOSIS — I10 Essential (primary) hypertension: Secondary | ICD-10-CM | POA: Diagnosis not present

## 2023-08-29 DIAGNOSIS — Z23 Encounter for immunization: Secondary | ICD-10-CM | POA: Diagnosis not present

## 2023-08-29 DIAGNOSIS — E785 Hyperlipidemia, unspecified: Secondary | ICD-10-CM | POA: Diagnosis not present

## 2023-08-29 DIAGNOSIS — D509 Iron deficiency anemia, unspecified: Secondary | ICD-10-CM | POA: Diagnosis not present

## 2023-08-29 DIAGNOSIS — N1831 Chronic kidney disease, stage 3a: Secondary | ICD-10-CM | POA: Diagnosis not present

## 2023-12-06 ENCOUNTER — Ambulatory Visit: Payer: Medicare Other | Admitting: Neurology

## 2024-01-03 ENCOUNTER — Other Ambulatory Visit: Payer: Self-pay | Admitting: Primary Care

## 2024-01-03 MED ORDER — OMEPRAZOLE 40 MG PO CPDR *I*
40.0000 mg | DELAYED_RELEASE_CAPSULE | Freq: Every day | ORAL | 1 refills | Status: AC
Start: 2024-01-03 — End: ?

## 2024-01-03 MED ORDER — METFORMIN HCL 500 MG PO TB24 *I*
500.0000 mg | ORAL_TABLET | Freq: Two times a day (BID) | ORAL | 1 refills | Status: AC
Start: 2024-01-03 — End: ?

## 2024-01-03 MED ORDER — TRAMADOL HCL 50 MG PO TABS *I*
50.0000 mg | ORAL_TABLET | Freq: Three times a day (TID) | ORAL | 0 refills | Status: AC
Start: 2024-01-03 — End: ?

## 2024-01-03 MED ORDER — ESCITALOPRAM OXALATE 10 MG PO TABS *I*
10.0000 mg | ORAL_TABLET | Freq: Every day | ORAL | 1 refills | Status: AC
Start: 2024-01-03 — End: ?

## 2024-01-03 MED ORDER — BENAZEPRIL-HYDROCHLOROTHIAZIDE 20-25 MG PO TABS *A*
1.0000 | ORAL_TABLET | Freq: Every day | ORAL | 1 refills | Status: AC
Start: 2024-01-03 — End: ?

## 2024-01-03 MED ORDER — CYCLOBENZAPRINE HCL 10 MG PO TABS *I*
10.0000 mg | ORAL_TABLET | Freq: Every day | ORAL | 1 refills | Status: AC
Start: 2024-01-03 — End: ?

## 2024-01-05 ENCOUNTER — Telehealth: Payer: Self-pay | Admitting: Neurology

## 2024-01-05 NOTE — Telephone Encounter (Signed)
 First Call- Left Message for patients daughter [name]. Please schedule an NPV with Dr.Gill next available per his request. Received an email to get both the patient and patients husband scheduled for an NPV

## 2024-01-11 ENCOUNTER — Telehealth: Payer: Self-pay | Admitting: Neurology

## 2024-01-11 NOTE — Telephone Encounter (Signed)
 Patient Telephone Call - Reschedule/Cancel Appointment    Caller name: [name]  Relationship to patient: Daughter   Callback number: [phone]  Initial appointment: 08/26  Cancellation reason: pt's daughter states that, pt moved back to [place], and no longer needs this appt   Does the patient plan to call back to reschedule?: No   Does the patient have any future appointments scheduled?: No

## 2024-02-19 ENCOUNTER — Other Ambulatory Visit: Payer: Self-pay

## 2024-02-19 ENCOUNTER — Emergency Department (HOSPITAL_COMMUNITY)

## 2024-02-19 ENCOUNTER — Encounter (HOSPITAL_COMMUNITY): Payer: Self-pay

## 2024-02-19 ENCOUNTER — Emergency Department (HOSPITAL_COMMUNITY)
Admission: EM | Admit: 2024-02-19 | Discharge: 2024-02-22 | Disposition: A | Discharge status: Home / Self Care | Attending: Emergency Medicine | Admitting: Emergency Medicine

## 2024-02-19 DIAGNOSIS — Z96651 Presence of right artificial knee joint: Secondary | ICD-10-CM | POA: Insufficient documentation

## 2024-02-19 DIAGNOSIS — E785 Hyperlipidemia, unspecified: Secondary | ICD-10-CM | POA: Insufficient documentation

## 2024-02-19 DIAGNOSIS — E119 Type 2 diabetes mellitus without complications: Secondary | ICD-10-CM | POA: Insufficient documentation

## 2024-02-19 DIAGNOSIS — F0150 Vascular dementia without behavioral disturbance: Secondary | ICD-10-CM | POA: Insufficient documentation

## 2024-02-19 DIAGNOSIS — I10 Essential (primary) hypertension: Secondary | ICD-10-CM | POA: Insufficient documentation

## 2024-02-19 DIAGNOSIS — R4182 Altered mental status, unspecified: Secondary | ICD-10-CM | POA: Diagnosis present

## 2024-02-19 DIAGNOSIS — J45909 Unspecified asthma, uncomplicated: Secondary | ICD-10-CM | POA: Insufficient documentation

## 2024-02-19 DIAGNOSIS — F0390 Unspecified dementia without behavioral disturbance: Secondary | ICD-10-CM | POA: Insufficient documentation

## 2024-02-19 LAB — URINALYSIS, ROUTINE W REFLEX MICROSCOPIC
Bilirubin Urine: NEGATIVE
Glucose, UA: NEGATIVE mg/dL
Hgb urine dipstick: NEGATIVE
Ketones, ur: NEGATIVE mg/dL
Nitrite: NEGATIVE
Protein, ur: NEGATIVE mg/dL
Specific Gravity, Urine: 1.013 (ref 1.005–1.030)
pH: 5 (ref 5.0–8.0)

## 2024-02-19 LAB — CBC
HCT: 35.8 % — ABNORMAL LOW (ref 36.0–46.0)
Hemoglobin: 11.7 g/dL — ABNORMAL LOW (ref 12.0–15.0)
MCH: 28 pg (ref 26.0–34.0)
MCHC: 32.7 g/dL (ref 30.0–36.0)
MCV: 85.6 fL (ref 80.0–100.0)
Platelets: 278 10*3/uL (ref 150–400)
RBC: 4.18 MIL/uL (ref 3.87–5.11)
RDW: 12.8 % (ref 11.5–15.5)
WBC: 7.6 10*3/uL (ref 4.0–10.5)
nRBC: 0 % (ref 0.0–0.2)

## 2024-02-19 LAB — COMPREHENSIVE METABOLIC PANEL WITH GFR
ALT: 22 U/L (ref 0–44)
AST: 26 U/L (ref 15–41)
Albumin: 3.8 g/dL (ref 3.5–5.0)
Alkaline Phosphatase: 52 U/L (ref 38–126)
Anion gap: 8 (ref 5–15)
BUN: 19 mg/dL (ref 8–23)
CO2: 28 mmol/L (ref 22–32)
Calcium: 9.6 mg/dL (ref 8.9–10.3)
Chloride: 102 mmol/L (ref 98–111)
Creatinine, Ser: 1.29 mg/dL — ABNORMAL HIGH (ref 0.44–1.00)
GFR, Estimated: 43 mL/min — ABNORMAL LOW (ref >60)
Glucose, Bld: 137 mg/dL — ABNORMAL HIGH (ref 70–99)
Potassium: 3.5 mmol/L (ref 3.5–5.1)
Sodium: 138 mmol/L (ref 135–145)
Total Bilirubin: 0.4 mg/dL (ref 0.0–1.2)
Total Protein: 7.1 g/dL (ref 6.5–8.1)

## 2024-02-19 LAB — CBG MONITORING, ED: Glucose-Capillary: 89 mg/dL (ref 70–99)

## 2024-02-19 NOTE — ED Provider Notes (Signed)
 [facility] Emergency Department Provider Note MRN:  [id number]  Arrival date & time: 02/19/24     Chief Complaint   Altered mental status History of Present Illness   Kristin Flores is a 78 y.o. year-old female with a history of diabetes, hyperlipidemia presenting to the ED with chief complaint of altered mental status.  Patient's family explains that patient has a history of vascular dementia and it seems to have worsened dramatically over the past few months.  Patient's spouse also has dementia and they have been living in squalor, not taking their medicines, they are not oriented and they are without capacity to make decisions.  Patient is pleasantly confused, admits to poor memory recently, has no complaints currently.  Review of Systems  A thorough review of systems was obtained and all systems are negative except as noted in the HPI and PMH.   Patient's Health History    Past Medical History:  Diagnosis Date   Abnormal weight gain    Anxiety and depression    Arthritis of left subtalar joint 10/11/2017   Asthma    Chronic left SI joint pain 02/16/2017   Chronic lumbar pain    Chronic pain of left ankle 02/07/2017   Chronic, continuous use of opioids 09/01/2021   Last Assessment & Plan:  Formatting of this note might be different from the original. Patient and I have discussed the hazardous effects of continued opiate pain medication usage. Risks and benefits of above medications including but not limited to possibility of hyperalgesia, respiratory depression, sedation, and even death were discussed with the patient who expressed an understanding. Patient    Constipation    DDD (degenerative disc disease), lumbar 09/01/2021   Last Assessment & Plan:  Formatting of this note might be different from the original. 78 year old female seen today in routine follow-up for chronic lower back pain without true radicular symptoms.  Her sister is present  with her today as she normally is.    Her most recent lumbar spine MRI was completed in 2016.  Unfortunately, there is no report or imaging to review in her EMR.  She did complet   Diabetes mellitus without complication (HCC)    borderline   Diverticulitis    Edema    Fibromyalgia    Fibromyalgia    GERD (gastroesophageal reflux disease)    Hyperlipidemia    Hypertension    Lumbar spondylolysis 09/01/2021   Last Assessment & Plan:  Formatting of this note might be different from the original. See lumbar DDD plan   Lumbar spondylosis 03/24/2022   Malaise and fatigue    Neuropathy 03/24/2022   Obesity    Osteoarthritis    Osteoporosis, postmenopausal    Other fatigue 02/16/2017   Pain in joint, lower leg 12/25/2015   Pain medication agreement 09/01/2021   Last Assessment & Plan:  Formatting of this note might be different from the original. Agreement up-to-date.   UDS to be collected today in compliance with clinic policies and procedures.   Patient did not display any signs of abuse or diversion and has been checked on the [place]  Controlled Substance website.   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   Sciatica, left side 06/27/2017   Type 2 diabetes, controlled, with neuropathy (HCC)    Vitamin D deficiency     Past Surgical  History:  Procedure Laterality Date   ANKLE FUSION Left    CHOLECYSTECTOMY     COLONOSCOPY     TOTAL KNEE ARTHROPLASTY Right 03/08/2016   Procedure: TOTAL KNEE ARTHROPLASTY;  Surgeon: Christie Cox, MD;  Location: [facility] OR;  Service: Orthopedics;  Laterality: Right;    Family History  Problem Relation Age of Onset   Lymphoma Mother    Lung cancer Father    Emphysema Father    Lung cancer Sister    COPD Brother    Heart Problems Brother    Emphysema Brother    Hypertension Brother    Stroke Maternal Grandfather    Heart attack  Paternal Grandfather     Social History   Socioeconomic History   Marital status: Married    Spouse name: Not on file   Number of children: 3   Years of education: 13   Highest education level: Not on file  Occupational History   Not on file  Tobacco Use   Smoking status: Never    Passive exposure: Past   Smokeless tobacco: Never  Vaping Use   Vaping status: Never Used  Substance and Sexual Activity   Alcohol use: No   Drug use: No   Sexual activity: Not on file  Other Topics Concern   Not on file  Social History Narrative   Right handed   Drinks caffeine   One flooor   Retired   One floor home   Husband lives with her in home   Social Drivers of Health   Financial Resource Strain: Not on file  Food Insecurity: No Food Insecurity (04/16/2021)   [facility]   Hunger Vital Sign    Worried About Running Out of Food in the Last Year: Never true    Ran Out of Food in the Last Year: Never true  Transportation Needs: No Transportation Needs (04/16/2021)   [facility]   PRAPARE - Transportation    Lack of Transportation (Medical): No    Lack of Transportation (Non-Medical): No  Physical Activity: Inactive (01/19/2023)   Exercise Vital Sign    Days of Exercise per Week: 0 days    Minutes of Exercise per Session: 0 min  Stress: Stress Concern Present (01/19/2023)   [employer] of Occupational Health - Occupational Stress Questionnaire    Feeling of Stress : Rather much  Social Connections: Not on file  Intimate Partner Violence: Unknown (07/14/2022)   [facility]   Intimate Partner Violence    Fear of Current or Ex-Partner: Not on file     Physical Exam   Vitals:   02/19/24 0104 02/19/24 0603  BP: (!) 168/83 (!) 169/89  Pulse: 60 (!) 48  Resp: 18 (!) 21  Temp: 98.4 F (36.9 C) 98.6 F (37 C)  SpO2: 100% 100%    CONSTITUTIONAL: Well-appearing, NAD NEURO/PSYCH:  Alert,  oriented to name only, moves all extremities equally EYES:  eyes equal and reactive ENT/NECK:  no LAD, no JVD CARDIO: Regular rate, well-perfused, normal S1 and S2 PULM:  CTAB no wheezing or rhonchi GI/GU:  non-distended, non-tender MSK/SPINE:  No gross deformities, no edema SKIN:  no rash, atraumatic   *Additional and/or pertinent findings included in MDM below  Diagnostic and Interventional Summary    EKG Interpretation Date/Time:  Sunday Feb 19 2024 01:40:33 EDT Ventricular Rate:  56 PR Interval:  241 QRS Duration:  111 QT Interval:  459 QTC Calculation:  443 R Axis:   -43  Text Interpretation: Sinus rhythm Prolonged PR interval Left axis deviation Low voltage, extremity and precordial leads Abnormal R-wave progression, late transition Confirmed by Gwenetta Lennert [phone]) on 02/19/2024 3:10:39 AM       Labs Reviewed  CBC - Abnormal; Notable for the following components:      Result Value   Hemoglobin 11.7 (*)    HCT 35.8 (*)    All other components within normal limits  COMPREHENSIVE METABOLIC PANEL WITH GFR - Abnormal; Notable for the following components:   Glucose, Bld 137 (*)    Creatinine, Ser 1.29 (*)    GFR, Estimated 43 (*)    All other components within normal limits  URINALYSIS, ROUTINE W REFLEX MICROSCOPIC - Abnormal; Notable for the following components:   APPearance HAZY (*)    Leukocytes,Ua TRACE (*)    Bacteria, UA RARE (*)    All other components within normal limits  CBG MONITORING, ED    CT HEAD WO CONTRAST ( )  Final Result    DG Chest Port 1 View  Final Result      Medications - No data to display   Procedures  /  Critical Care Procedures  ED Course and Medical Decision Making  Initial Impression and Ddx Concern for worsening dementia and now patient no longer with ability to care for herself.  Will initiate medical workup to ensure no obvious electrolyte disturbance or intracranial mass.  Anticipating TOC order status for  placement.  Past medical/surgical history that increases complexity of ED encounter: Vascular dementia  Interpretation of Diagnostics I personally reviewed the Laboratory Testing and my interpretation is as follows: No significant blood count or electrolyte disturbance.  EKG without ischemic findings, chest x-ray unremarkable, no signs of urine infection, CT head without acute findings.    Patient Reassessment and Ultimate Disposition/Management     Patient is now medically cleared, will await TOC evaluation and placement.  Calm and cooperative.  Signed out to default provider.  Patient management required discussion with the following services or consulting groups:  None  Complexity of Problems Addressed Acute illness or injury that poses threat of life of bodily function  Additional Data Reviewed and Analyzed Further history obtained from: Further history from spouse/family member  Additional Factors Impacting ED Encounter Risk Consideration of hospitalization  Merrick Abe. Harless Lien, MD [facility] Emergency Medicine [facility] [email or web address]  Final Clinical Impressions(s) / ED Diagnoses     ICD-10-CM   1. Dementia, unspecified dementia severity, unspecified dementia type, unspecified whether behavioral, psychotic, or mood disturbance or anxiety (HCC)  F03.90       ED Discharge Orders     None        Discharge Instructions Discussed with and Provided to Patient:   Discharge Instructions   None      Edson Graces, MD 02/19/24 [phone]

## 2024-02-19 NOTE — ED Notes (Signed)
 PT at bedside.

## 2024-02-19 NOTE — Evaluation (Signed)
 Physical Therapy Evaluation Patient Details Name: Kristin Flores MRN: [id number] DOB: [birth year] Today's Date: 02/19/2024  History of Present Illness  Pt is 78 yo presenting [facility] ED on 5/18 due to AMS. PMH: vascular dementia, DM, hyperlipidemia  Clinical Impression  Pt is presenting slightly below baseline level of function and is at a high risk for falls due to current functional mobility level and cognitive status. Pt is currently Min A for bed mobility, sit to stand and gait with HHA. Pt has no help at home and lives with spouse who has alzheimers. Due to pt current functional status, home set up and available assistance at home recommending skilled physical therapy services < 3 hours/day in order to address strength, balance and functional mobility to decrease risk for falls, injury, immobility, skin break down and re-hospitalization.          If plan is discharge home, recommend the following: A little help with walking and/or transfers;Assistance with cooking/housework;Supervision due to cognitive status;Assist for transportation;Help with stairs or ramp for entrance   Can travel by private vehicle   No    Equipment Recommendations None recommended by PT     Functional Status Assessment Patient has had a recent decline in their functional status and demonstrates the ability to make significant improvements in function in a reasonable and predictable amount of time.     Precautions / Restrictions Precautions Precautions: Fall Recall of Precautions/Restrictions: Impaired Restrictions Weight Bearing Restrictions Per Provider Order: No      Mobility  Bed Mobility Overal bed mobility: Needs Assistance Bed Mobility: Supine to Sit, Sit to Supine     Supine to sit: Min assist Sit to supine: Min assist   General bed mobility comments: Min A for trunk for out of bed and LE to get into bed with verbal cues for sequencing.    Transfers Overall transfer level: Needs  assistance Equipment used: 1 person hand held assist Transfers: Sit to/from Stand Sit to Stand: Min assist           General transfer comment: Min A for stabilization on standing.    Ambulation/Gait Ambulation/Gait assistance: Min assist Gait Distance (Feet): 80 Feet Assistive device: 1 person hand held assist Gait Pattern/deviations: Step-through pattern, Narrow base of support, Decreased stride length Gait velocity: decreased Gait velocity interpretation: <1.8 ft/sec, indicate of risk for recurrent falls   General Gait Details: Flat foot initial contact, short step throught reciprocal gait pattern with frontal plane sway requiring Min A for balance especially with turns.     Balance Overall balance assessment: Needs assistance Sitting-balance support: Bilateral upper extremity supported, Feet unsupported Sitting balance-Leahy Scale: Fair     Standing balance support: Single extremity supported, During functional activity Standing balance-Leahy Scale: Poor Standing balance comment: Min A especially with turns         Pertinent Vitals/Pain Pain Assessment Pain Assessment: No/denies pain    Home Living Family/patient expects to be discharged to:: Skilled nursing facility       Additional Comments: Pt was living with spouse who has alzheimers.    Prior Function Prior Level of Function : Patient poor historian/Family not available             Mobility Comments: Per notes pt and spouse were living independently. Pt states she does not use an AD but pt is a poor historian. ADLs Comments: Unclear pt and spouse were apparently living independently with difficulty maintaining ADL"s per [facility]     Extremity/Trunk Assessment  Upper Extremity Assessment Upper Extremity Assessment: Generalized weakness;Defer to OT evaluation    Lower Extremity Assessment Lower Extremity Assessment: Generalized weakness    Cervical / Trunk Assessment Cervical / Trunk Assessment:  Normal  Communication   Communication Communication: No apparent difficulties    Cognition Arousal: Alert Behavior During Therapy: WFL for tasks assessed/performed, Anxious   PT - Cognitive impairments: History of cognitive impairments     PT - Cognition Comments: Pt likes to speak of her spouse. Slightly perseverating that he spouse might be flirting with a nurse. Following commands: Intact       Cueing Cueing Techniques: Verbal cues     General Comments General comments (skin integrity, edema, etc.): No signs/symptoms of cardiac/respiratory distress during session        Assessment/Plan    PT Assessment Patient needs continued PT services  PT Problem List Decreased strength;Decreased activity tolerance;Decreased balance;Decreased mobility;Decreased safety awareness;Decreased cognition       PT Treatment Interventions DME instruction;Balance training;Neuromuscular re-education;Gait training;Stair training;Functional mobility training;Therapeutic activities;Therapeutic exercise;Patient/family education    PT Goals (Current goals can be found in the Care Plan section)  Acute Rehab PT Goals Patient Stated Goal: To see her husband PT Goal Formulation: With patient Time For Goal Achievement: 03/04/24 Potential to Achieve Goals: Fair    Frequency Min 1X/week        AM-PAC PT "6 Clicks" Mobility  Outcome Measure Help needed turning from your back to your side while in a flat bed without using bedrails?: A Little Help needed moving from lying on your back to sitting on the side of a flat bed without using bedrails?: A Little Help needed moving to and from a bed to a chair (including a wheelchair)?: A Little Help needed standing up from a chair using your arms (e.g., wheelchair or bedside chair)?: A Little Help needed to walk in hospital room?: A Little Help needed climbing 3-5 steps with a railing? : A Lot 6 Click Score: 17    End of Session Equipment Utilized During  Treatment: Gait belt Activity Tolerance: Patient tolerated treatment well Patient left: in bed;with call bell/phone within reach Nurse Communication: Mobility status PT Visit Diagnosis: Unsteadiness on feet (R26.81);Other abnormalities of gait and mobility (R26.89);Muscle weakness (generalized) (M62.81)    Time: 4098-1191 PT Time Calculation (min) (ACUTE ONLY): 17 min   Charges:   PT Evaluation $PT Eval Low Complexity: 1 Low   PT General Charges $$ ACUTE PT VISIT: 1 Visit        Sloan Duncans, DPT, CLT  Acute Rehabilitation Services Office: [phone] (Secure chat preferred)   Jenice Mitts 02/19/2024, 4:15 PM

## 2024-02-19 NOTE — NC FL2 (Cosign Needed Addendum)
 [place]  MEDICAID FL2 LEVEL OF CARE FORM     IDENTIFICATION  Patient Name: Kristin Flores Birthdate: 04/04/1946 Sex: female Admission Date (Current Location): 02/19/2024  [facility] and [place] Number:  [employer] and Address:  [facility], [address], [place], [place] [address]      Provider Number: [id number]  Attending Physician Name and Address:  System, Provider Not In  Relative Name and Phone Number:       Current Level of Care: Hospital Recommended Level of Care: Skilled Nursing Facility Prior Approval Number:    Date Approved/Denied:   PASRR Number: [id number]  Discharge Plan: Home    Current Diagnoses: Patient Active Problem List   Diagnosis Date Noted   Mild cognitive impairment 05/20/2022   Neuropathy 03/24/2022   Lumbar spondylosis 03/24/2022   Abnormal weight gain 09/14/2021   Anxiety and depression 09/14/2021   Asthma 09/14/2021   Chronic lumbar pain 09/14/2021   Constipation 09/14/2021   Diabetes mellitus without complication (HCC) 09/14/2021   Diverticulitis 09/14/2021   Edema 09/14/2021   GERD (gastroesophageal reflux disease) 09/14/2021   Hyperlipidemia 09/14/2021   Hypertension 09/14/2021   Malaise and fatigue 09/14/2021   Obesity 09/14/2021   Osteoarthritis 09/14/2021   Osteoporosis, 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   Postmenopausal 09/14/2021   Rash of unknown cause 09/14/2021   Type 2 diabetes, controlled, with neuropathy (HCC) 09/14/2021   Vitamin D deficiency 09/14/2021   Chronic, continuous use of opioids 09/01/2021   DDD (degenerative disc disease), lumbar 09/01/2021   Lumbar spondylolysis 09/01/2021   Pain medication agreement 09/01/2021   Arthritis of left subtalar joint 10/11/2017   Sciatica, left side 06/27/2017   Fibromyalgia 02/16/2017   Other fatigue 02/16/2017   Primary insomnia 02/16/2017   Chronic left SI joint pain 02/16/2017   Chronic pain of left ankle 02/07/2017   Presence of  right artificial knee joint 03/23/2016   S/P total knee replacement 03/08/2016   Pain in joint, lower leg 12/25/2015    Orientation RESPIRATION BLADDER Height & Weight     Self, Place  Normal Continent Weight: 199 lb (90.3 kg) Height:  5\' 5"  (165.1 cm)  BEHAVIORAL SYMPTOMS/MOOD NEUROLOGICAL BOWEL NUTRITION STATUS      Continent Diet (Regular diet)  AMBULATORY STATUS COMMUNICATION OF NEEDS Skin   Limited Assist Verbally Normal                       Personal Care Assistance Level of Assistance  Bathing, Feeding, Dressing Bathing Assistance: Maximum assistance Feeding assistance: Limited assistance Dressing Assistance: Maximum assistance     Functional Limitations Info  Sight, Hearing, Speech Sight Info: Adequate Hearing Info: Adequate Speech Info: Adequate    SPECIAL CARE FACTORS FREQUENCY  PT (By licensed PT), OT (By licensed OT)     PT Frequency: 5x weekly OT Frequency: 5x weekly            Contractures Contractures Info: Not present    Additional Factors Info  Code Status, Allergies Code Status Info: Full Code Allergies Info: Codeine           Current Medications (02/22/2024):  This is the current hospital active medication list Current Facility-Administered Medications  Medication Dose Route Frequency Provider Last Rate Last Admin   albuterol  (PROVENTIL ) (2.5 MG/3ML) 0.083% nebulizer solution 2.5 mg  2.5 mg Nebulization Q6H PRN Scarlette Currier, MD       amLODipine  (NORVASC ) tablet 5 mg  5 mg Oral Daily Scarlette Currier, MD  5 mg at 02/22/24 1008   aspirin  EC tablet 81 mg  81 mg Oral Daily Scarlette Currier, MD   81 mg at 02/22/24 1008   benazepril  (LOTENSIN ) tablet 5 mg  5 mg Oral Daily Scarlette Currier, MD   5 mg at 02/22/24 1037   And   hydrochlorothiazide  (HYDRODIURIL ) tablet 25 mg  25 mg Oral Daily Scarlette Currier, MD   25 mg at 02/22/24 1008   cephALEXin  (KEFLEX ) capsule 500 mg  500 mg Oral Q8H Scarlette Currier, MD   500 mg at 02/22/24 0820    donepezil  (ARICEPT ) tablet 10 mg  10 mg Oral QHS Scarlette Currier, MD   10 mg at 02/21/24 2114   fluticasone  furoate-vilanterol (BREO ELLIPTA ) 100-25 MCG/ACT 1 puff  1 puff Inhalation Daily Scarlette Currier, MD   1 puff at 02/22/24 0819   melatonin tablet 5 mg  5 mg Oral QHS Penna, Michael A, DO   5 mg at 02/21/24 2114   metFORMIN  (GLUCOPHAGE -XR) 24 hr tablet 500 mg  500 mg Oral BID WC Schlossman, Erin, MD   500 mg at 02/22/24 0820   pantoprazole  (PROTONIX ) EC tablet 40 mg  40 mg Oral Daily Schlossman, Erin, MD   40 mg at 02/22/24 1008   rosuvastatin  (CRESTOR ) tablet 10 mg  10 mg Oral QPM Scarlette Currier, MD   10 mg at 02/21/24 1719   spironolactone  (ALDACTONE ) tablet 25 mg  25 mg Oral Daily Scarlette Currier, MD   25 mg at 02/22/24 1008   Current Outpatient Medications  Medication Sig Dispense Refill   albuterol  (PROVENTIL  HFA;VENTOLIN  HFA) 108 (90 Base) MCG/ACT inhaler Inhale 1-2 puffs into the lungs every 6 (six) hours as needed for wheezing or shortness of breath. (Patient not taking: Reported on 02/19/2024)     alendronate  (FOSAMAX ) 70 MG tablet Take 70 mg by mouth once a week. (Patient not taking: Reported on 02/19/2024)     amLODipine  (NORVASC ) 5 MG tablet Take 5 mg by mouth daily. (Patient not taking: Reported on 02/19/2024)     aspirin  EC 81 MG tablet Take 81 mg by mouth daily. Swallow whole. (Patient not taking: Reported on 02/19/2024)     benazepril -hydrochlorthiazide (LOTENSIN  HCT) 20-25 MG tablet Take 1 tablet by mouth daily. (Patient not taking: Reported on 02/19/2024)     celecoxib  (CELEBREX ) 200 MG capsule Take 200 mg by mouth daily. (Patient not taking: Reported on 02/19/2024)     cyclobenzaprine (FLEXERIL) 10 MG tablet Take 10 mg by mouth daily.  (Patient not taking: Reported on 02/19/2024)     donepezil  (ARICEPT ) 10 MG tablet Take 1/2 tablet daily for 2 weeks, then increase to 1 tablet daily (Patient not taking: Reported on 02/19/2024) 90 tablet 3   ergocalciferol (VITAMIN D2) 1.25 MG  (50000 UT) capsule Take 50,000 Units by mouth once a week. (Patient not taking: Reported on 02/19/2024)     escitalopram (LEXAPRO) 10 MG tablet Take 10 mg by mouth daily. (Patient not taking: Reported on 02/19/2024)     ketoconazole (NIZORAL) 2 % cream Apply 1 application topically 2 (two) times daily. (Patient not taking: Reported on 02/19/2024)     metFORMIN  (GLUCOPHAGE -XR) 500 MG 24 hr tablet Take 500 mg by mouth 2 (two) times daily.  (Patient not taking: Reported on 02/19/2024)     mometasone-formoterol (DULERA) 200-5 MCG/ACT AERO Inhale 2 puffs into the lungs 2 (two) times daily. (Patient not taking: Reported on 02/19/2024)     omeprazole (PRILOSEC) 40 MG capsule Take 1 capsule by  mouth daily. (Patient not taking: Reported on 02/19/2024)     rosuvastatin  (CRESTOR ) 10 MG tablet Take 10 mg by mouth every evening. (Patient not taking: Reported on 02/19/2024)     spironolactone  (ALDACTONE ) 25 MG tablet Take 25 mg by mouth daily. (Patient not taking: Reported on 02/19/2024)     traMADol (ULTRAM) 50 MG tablet Take 50 mg by mouth every 8 (eight) hours as needed for pain. (Patient not taking: Reported on 02/19/2024)       Discharge Medications: Please see AVS for a list of discharge medications.  Relevant Imaging Results:  Relevant Lab Results:   Additional Information SSN: [id number]  Dexter Forest, LCSW

## 2024-02-19 NOTE — ED Triage Notes (Signed)
 Pt here from home with family with c/o aloc  pt alert and oriented to self and place at times , according to brother in law pt and husband have not been doing well recently , forgetting meds , not  cooking, pt states that she mostly eats take out ,

## 2024-02-19 NOTE — Progress Notes (Addendum)
 12:45pm: CSW received call back from [place] - an APS report was made.  12:32pm: CSW received call from [place] who states she will speak with APS supervisor and return call to CSW.  12:25pm: CSW attempted to reach Rob Chihuahua at [facility] DSS without success - a voicemail was left requesting a return call.  10:35am: CSW attempted to reach patient's brother at [phone] without success - no voicemail option available.   CSW spoke with patient's daughter Concha Deed. Concha Deed states patient and her husband lived with her in [place], [place] for about 4 months prior to demanding to be sent back to their home in [place]. Concha Deed states she brought patient and her husband to [place] on 01/10/24. Concha Deed states patient has Alzheimer's and wife has dementia. Concha Deed states the home is a Ecologist" house. Concha Deed states the home is filthy and unlivable. Concha Deed states she does not have current plans to come back to [place] due to working full time and having an handicap adult son. Concha Deed states she has POA paperwork for both patient and her husband electing her as the Management consultant. Concha Deed informed that patient is waiting is waiting on a PT evaluation and her husband is waiting on a TTS evaluation. CSW explained that [facility] Medicare will only cover STR and not memory care or LTC. Concha Deed states she has paid the couples bills for years and has access to their bank accounts. Concha Deed states there is no Medicaid in place at this time. Concha Deed states her uncle Rich Champ lives close to patient and her husband but is not able to provide care other than transportation. Concha Deed states both patient and her husband are medication noncompliant.  9:20am: CSW spoke with dispatch at [facility] non-emergency to request a return call from on call APS social worker.  8:25am: Patient presents to ED, brought in by family for being demented, medication noncompliant and living in squalor. Patient's husband is also present in ED.  Per PING - patient has  active [facility] Medicare.#[id number].  CSW spoke with RN CM who will order PT evaluations to determine the appropriate level of care.  Shepard Dicker, MSW, LCSW Transitions of Care  Clinical Social Worker II [phone]

## 2024-02-20 MED ORDER — ALBUTEROL SULFATE (2.5 MG/3ML) 0.083% IN NEBU: 2.5000 mg | INHALATION_SOLUTION | Freq: Four times a day (QID) | RESPIRATORY_TRACT | Status: DC | PRN

## 2024-02-20 MED ORDER — HYDROCHLOROTHIAZIDE 25 MG PO TABS: 25.0000 mg | ORAL_TABLET | Freq: Every day | ORAL | Status: DC

## 2024-02-20 MED ORDER — DONEPEZIL HCL 10 MG PO TABS
10.0000 mg | ORAL_TABLET | Freq: Every day | ORAL | Status: DC
  Administered 2024-02-20 – 2024-02-22 (×3): 10 mg via ORAL
  Filled 2024-02-20 (×3): qty 1

## 2024-02-20 MED ORDER — ASPIRIN 81 MG PO TBEC
81.0000 mg | DELAYED_RELEASE_TABLET | Freq: Every day | ORAL | Status: DC
  Administered 2024-02-20 – 2024-02-22 (×3): 81 mg via ORAL
  Filled 2024-02-20 (×3): qty 1

## 2024-02-20 MED ORDER — METFORMIN HCL ER 500 MG PO TB24
500.0000 mg | ORAL_TABLET | Freq: Two times a day (BID) | ORAL | Status: DC
  Administered 2024-02-20 – 2024-02-22 (×6): 500 mg via ORAL
  Filled 2024-02-20 (×9): qty 1

## 2024-02-20 MED ORDER — ROSUVASTATIN CALCIUM 5 MG PO TABS
10.0000 mg | ORAL_TABLET | Freq: Every evening | ORAL | Status: DC
Start: 2024-02-20 — End: 2024-02-23
  Administered 2024-02-20 – 2024-02-22 (×3): 10 mg via ORAL
  Filled 2024-02-20 (×3): qty 2

## 2024-02-20 MED ORDER — HYDROCHLOROTHIAZIDE 25 MG PO TABS
25.0000 mg | ORAL_TABLET | Freq: Every day | ORAL | Status: DC
  Administered 2024-02-20 – 2024-02-22 (×3): 25 mg via ORAL
  Filled 2024-02-20 (×3): qty 1

## 2024-02-20 MED ORDER — SPIRONOLACTONE 25 MG PO TABS
25.0000 mg | ORAL_TABLET | Freq: Every day | ORAL | Status: DC
  Administered 2024-02-20 – 2024-02-22 (×3): 25 mg via ORAL
  Filled 2024-02-20 (×3): qty 1

## 2024-02-20 MED ORDER — ALBUTEROL SULFATE HFA 108 (90 BASE) MCG/ACT IN AERS: 1.0000 | INHALATION_SPRAY | Freq: Four times a day (QID) | RESPIRATORY_TRACT | Status: DC | PRN

## 2024-02-20 MED ORDER — BENAZEPRIL HCL 5 MG PO TABS
5.0000 mg | ORAL_TABLET | Freq: Every day | ORAL | Status: DC
  Administered 2024-02-21 – 2024-02-22 (×2): 5 mg via ORAL
  Filled 2024-02-20 (×4): qty 1

## 2024-02-20 MED ORDER — CEPHALEXIN 250 MG PO CAPS
500.0000 mg | ORAL_CAPSULE | Freq: Three times a day (TID) | ORAL | Status: DC
  Administered 2024-02-20 – 2024-02-22 (×9): 500 mg via ORAL
  Filled 2024-02-20 (×9): qty 2

## 2024-02-20 MED ORDER — PANTOPRAZOLE SODIUM 40 MG PO TBEC
40.0000 mg | DELAYED_RELEASE_TABLET | Freq: Every day | ORAL | Status: DC
  Administered 2024-02-20 – 2024-02-22 (×3): 40 mg via ORAL
  Filled 2024-02-20 (×3): qty 1

## 2024-02-20 MED ORDER — AMLODIPINE BESYLATE 5 MG PO TABS
5.0000 mg | ORAL_TABLET | Freq: Every day | ORAL | Status: DC
  Administered 2024-02-20 – 2024-02-22 (×3): 5 mg via ORAL
  Filled 2024-02-20 (×3): qty 1

## 2024-02-20 MED ORDER — FLUTICASONE FUROATE-VILANTEROL 100-25 MCG/ACT IN AEPB
1.0000 | INHALATION_SPRAY | Freq: Every day | RESPIRATORY_TRACT | Status: DC
  Administered 2024-02-20 – 2024-02-22 (×3): 1 via RESPIRATORY_TRACT
  Filled 2024-02-20 (×3): qty 28

## 2024-02-20 MED ORDER — BENAZEPRIL-HYDROCHLOROTHIAZIDE 20-25 MG PO TABS: 1.0000 | ORAL_TABLET | Freq: Every day | ORAL | Status: DC

## 2024-02-20 MED ORDER — BENAZEPRIL HCL 20 MG PO TABS
20.0000 mg | ORAL_TABLET | Freq: Every day | ORAL | Status: DC
  Filled 2024-02-20: qty 1

## 2024-02-20 MED ORDER — ALENDRONATE SODIUM 70 MG PO TABS
70.0000 mg | ORAL_TABLET | ORAL | Status: DC
  Filled 2024-02-20: qty 1

## 2024-02-20 NOTE — ED Provider Notes (Signed)
 Emergency Medicine Observation Re-evaluation Note  Kristin Flores is a 78 y.o. female, seen on rounds today.  Pt initially presented to the ED for complaints of No chief complaint on file. Currently, the patient is awaiting placement.  Hx of DM, vascular dementia, concern for progression and not taking care of herself.   She denies chest pain, dyspnea, numbness/weakness/change in vision/trouble walking. No headache. Reports just feels tired. Did not sleep well last night.  Physical Exam  BP (!) 210/69   Pulse 65   Temp 98 F (36.7 C)   Resp 16   Ht 5\' 5"  (1.651 m)   Wt 90.3 kg   SpO2 100%   BMI 33.12 kg/m  Physical Exam General: NAD Cardiac: RR Lungs: even unlabored Psych: NA  ED Course / MDM  EKG:EKG Interpretation Date/Time:  Sunday Feb 19 2024 01:40:33 EDT Ventricular Rate:  56 PR Interval:  241 QRS Duration:  111 QT Interval:  459 QTC Calculation: 443 R Axis:   -43  Text Interpretation: Sinus rhythm Prolonged PR interval Left axis deviation Low voltage, extremity and precordial leads Abnormal R-wave progression, late transition Confirmed by Gwenetta Lennert [phone]) on 02/19/2024 3:10:39 AM  I have reviewed the labs performed to date as well as medications administered while in observation.  Recent changes in the last 24 hours include none.  Discussed with daughter. Ordered home meds. She has not taken them since April but list we have is up to date on what she should be taking. Did start benazepril  at lower dose given recent weight loss.  Hypertensive this AM.  Plan  Current plan is for SNF or memory care placement.    Scarlette Currier, MD 02/20/24 [phone]

## 2024-02-20 NOTE — ED Notes (Signed)
 Walked pt over to [facility] to have a visit with her husband. Pt is at bedside in room 50

## 2024-02-20 NOTE — Progress Notes (Addendum)
 2:48pm: CSW received call from patient's daughter to discuss updates. Concha Deed states she spoke with Alvy Baar of financial counseling regarding Medicaid application. Concha Deed agreeable to review [facility] as it is currently the only bed offer both patient and her husband have.   2:10pm: CSW spoke with Tammy at [facility] who states she completed visit with patient. Tammy unable to provide firm bed offer at this time for patient's husband and the two need to be placed in the same facility. Tammy requested a medication adjustment for patient's husband due to agitation - MD aware of request.  12:25pm: CSW spoke with Kia at [facility] who states she can offer patient a bed. CSW will wait for second liaison to visit then present patient's daughter with offers.  11:42am: CSW spoke with patient's daughter to discuss discharge plan. CSW informed Concha Deed of placement efforts. Concha Deed agreeable for liasion visits to adie with placement. Concha Deed states she is agreeable for CSW to contact financial counseling to initiate Medicaid applications for both patient and his wife. Concha Deed states patient's income is $1,148 (SSA) and $32 (pension) monthly. Concha Deed states the couple does not fully own their home and has a mortgage payment.  CSW will contact financial counseling to request Medicaid applications be initiated.  10:50am: CSW spoke with Kia at [facility] who states she is coming to visit patient her husband now.  9:20am: CSW spoke with Shirlyn Dowdy of [facility] to request she review patient and her husband for possible admission to the facility. Tammy states she will come and visit patient and husband around lunch today.  7:50am: CSW completed FL2 and faxed patient's clinical information out for review to obtain bed offers.  Shepard Dicker, MSW, LCSW Transitions of Care  Clinical Social Worker II [phone]

## 2024-02-21 LAB — URINE CULTURE

## 2024-02-21 MED ORDER — MELATONIN 5 MG PO TABS
5.0000 mg | ORAL_TABLET | Freq: Every day | ORAL | Status: DC
  Administered 2024-02-21: 5 mg via ORAL
  Filled 2024-02-21 (×2): qty 1

## 2024-02-21 MED ORDER — ACETAMINOPHEN 500 MG PO TABS
1000.0000 mg | ORAL_TABLET | Freq: Once | ORAL | Status: AC
  Administered 2024-02-21: 1000 mg via ORAL
  Filled 2024-02-21: qty 2

## 2024-02-21 NOTE — ED Provider Notes (Signed)
 Emergency Medicine Observation Re-evaluation Note  Kristin Flores is a 78 y.o. female, seen on rounds today.  Pt initially presented to the ED for complaints of No chief complaint on file. Currently, the patient is resting in bed.  Physical Exam  BP (!) 167/72   Pulse (!) 41   Temp 97.9 F (36.6 C) (Oral)   Resp 13   Ht 5\' 5"  (1.651 m)   Wt 90.3 kg   SpO2 100%   BMI 33.12 kg/m  Physical Exam General: Awake, alert, nondistressed Cardiac: Bradycardic, normotensive, extremities well-perfused Lungs: Breathing is unlabored Psych: No agitation  ED Course / MDM  EKG:EKG Interpretation Date/Time:  Sunday Feb 19 2024 01:40:33 EDT Ventricular Rate:  56 PR Interval:  241 QRS Duration:  111 QT Interval:  459 QTC Calculation: 443 R Axis:   -43  Text Interpretation: Sinus rhythm Prolonged PR interval Left axis deviation Low voltage, extremity and precordial leads Abnormal R-wave progression, late transition Confirmed by Gwenetta Lennert [phone]) on 02/19/2024 3:10:39 AM  I have reviewed the labs performed to date as well as medications administered while in observation.  Recent changes in the last 24 hours include none.  Patient was identified to have sinus bradycardia.  She is placed on cardiac monitor.  She has remained normotensive to hypertensive.  She is unable to state if she typically has a low heart rate at baseline.  Per chart review, not on any AV nodal agents.  Will keep her on monitor for now.  Plan  Current plan is for placement.    Iva Mariner, MD 02/21/24 [phone]

## 2024-02-21 NOTE — ED Notes (Signed)
 Pt assisted on to bed pan and changed into a gown

## 2024-02-21 NOTE — ED Notes (Signed)
 Dr.Vero Notified that pt HR was 45. No verbal orders given to this RN.

## 2024-02-21 NOTE — Progress Notes (Addendum)
 3:30pm: CSW received message from [facility] stating patient's daughter has accepted bed offer.  CSW initiated insurance authorization.  3pm: CSW spoke with Margretta Shi who states she can offer beds to patient. Margretta Shi states she will contact patient's daughter to present her with bed offer.  1:50pm: CSW received call from patient's daughter who submitted Medicaid applications. Concha Deed reports the reference number for the Medicaid application is #[id number]. CSW informed Concha Deed of the possibility that patient may have a bed available in [place] for temporary placement until a bed opens up in the memory care unit at [place]. Concha Deed states agreement to this and is agreeable to speak with Aleta Anda regarding this information. Concha Deed states she is free at 3pm for discussion.  CSW informed Margretta Shi of information.  12:30pm: CSW spoke with Margretta Shi who states that [facility] does not have any female memory care beds at this time. Margretta Shi states that [facility] has beds available for both patient and her husband. Margretta Shi states she will determine if facility can accept both patients and discuss it further with patient's daughter then return call to CSW.   10:20am: CSW spoke with Aleta Anda at [facility] in [place] to discuss patient. Margretta Shi to review referral and return call to CSW.  9:25am: CSW received call from patient's daughter requesting make referrals to [facility] facilities as she has a family member that works at [employer] in [place].  CSW sent referrals via hub for review.  Shepard Dicker, MSW, LCSW Transitions of Care  Clinical Social Worker II [phone]

## 2024-02-21 NOTE — Progress Notes (Signed)
 Physical Therapy Treatment Patient Details Name: Kristin Flores MRN: [id number] DOB: 12-31-1945 Today's Date: 02/21/2024   History of Present Illness Pt is 78 yo presenting to [facility] ED on 5/18 due to AMS, UTI. PMH: vascular dementia, DM, hyperlipidemia    PT Comments  Pt A&Ox1, thinks she is in [place] in her daughter's home. Pt requiring min encouragement to participate in OOB given fatigue, but once mobilizing pt motivated. Pt ambulatory for hallway distance, overall requiring light PT assist throughout for balance. Pt intermittently reaching for environment during gait to self-steady and to rest as needed. Plan for post-acute rehab remains appropriate.      If plan is discharge home, recommend the following: A little help with walking and/or transfers;Assistance with cooking/housework;Supervision due to cognitive status;Assist for transportation;Help with stairs or ramp for entrance   Can travel by private vehicle     No  Equipment Recommendations  None recommended by PT    Recommendations for Other Services       Precautions / Restrictions Precautions Precautions: Fall Recall of Precautions/Restrictions: Impaired Restrictions Weight Bearing Restrictions Per Provider Order: No     Mobility  Bed Mobility Overal bed mobility: Needs Assistance Bed Mobility: Supine to Sit, Sit to Supine     Supine to sit: Min assist Sit to supine: Supervision   General bed mobility comments: assist for trunk elevation, pt able to bring self back into bed with increased time.    Transfers Overall transfer level: Needs assistance Equipment used: 1 person hand held assist Transfers: Sit to/from Stand Sit to Stand: Min assist           General transfer comment: light rise and steady assist, stand x2 from EOB and toilet.    Ambulation/Gait Ambulation/Gait assistance: Min assist Gait Distance (Feet): 100 Feet Assistive device: 1 person hand held assist Gait  Pattern/deviations: Step-through pattern, Narrow base of support, Decreased stride length Gait velocity: decr     General Gait Details: assist to steady via HHA, pt occasionally reaching for environment to self-steady as well   Stairs             Wheelchair Mobility     Tilt Bed    Modified Rankin (Stroke Patients Only)       Balance Overall balance assessment: Needs assistance Sitting-balance support: Bilateral upper extremity supported, Feet unsupported Sitting balance-Leahy Scale: Fair     Standing balance support: Single extremity supported, During functional activity Standing balance-Leahy Scale: Poor Standing balance comment: Min A especially with turns                            Communication Communication Communication: No apparent difficulties  Cognition Arousal: Alert Behavior During Therapy: WFL for tasks assessed/performed, Anxious   PT - Cognitive impairments: History of cognitive impairments                       PT - Cognition Comments: Pt likes to speak of her spouse, asks repetitive questions about where spouse is. A&Ox1, pt believes she is in [place] with daughter Following commands: Intact      Cueing Cueing Techniques: Verbal cues  Exercises      General Comments General comments (skin integrity, edema, etc.): vss pre and post gait      Pertinent Vitals/Pain Pain Assessment Pain Assessment: No/denies pain    Home Living  Prior Function            PT Goals (current goals can now be found in the care plan section) Acute Rehab PT Goals Patient Stated Goal: To see her husband PT Goal Formulation: With patient Time For Goal Achievement: 03/04/24 Potential to Achieve Goals: Fair Progress towards PT goals: Progressing toward goals    Frequency    Min 1X/week      PT Plan      Co-evaluation              AM-PAC PT "6 Clicks" Mobility   Outcome  Measure  Help needed turning from your back to your side while in a flat bed without using bedrails?: A Little Help needed moving from lying on your back to sitting on the side of a flat bed without using bedrails?: A Little Help needed moving to and from a bed to a chair (including a wheelchair)?: A Little Help needed standing up from a chair using your arms (e.g., wheelchair or bedside chair)?: A Little Help needed to walk in hospital room?: A Little Help needed climbing 3-5 steps with a railing? : A Lot 6 Click Score: 17    End of Session Equipment Utilized During Treatment: Gait belt Activity Tolerance: Patient tolerated treatment well Patient left: in bed;with call bell/phone within reach;with bed alarm set Nurse Communication: Mobility status PT Visit Diagnosis: Unsteadiness on feet (R26.81);Other abnormalities of gait and mobility (R26.89);Muscle weakness (generalized) (M62.81)     Time: 1610-9604 PT Time Calculation (min) (ACUTE ONLY): 19 min  Charges:    $Gait Training: 8-22 mins PT General Charges $$ ACUTE PT VISIT: 1 Visit                     Shirlene Doughty, PT DPT Acute Rehabilitation Services Secure Chat Preferred  Office [phone]    Bridey Brookover Cydney Draft 02/21/2024, 3:59 PM

## 2024-02-22 MED ORDER — MELATONIN 10 MG PO CHEW: 10.0000 mg | CHEWABLE_TABLET | Freq: Every evening | ORAL | 0 refills | Status: AC | PRN

## 2024-02-22 MED ORDER — SPIRONOLACTONE 25 MG PO TABS
25.0000 mg | ORAL_TABLET | Freq: Every day | ORAL | 0 refills | Status: AC
Start: 2024-02-22 — End: ?

## 2024-02-22 MED ORDER — ACETAMINOPHEN 500 MG PO TABS: 500.0000 mg | ORAL_TABLET | Freq: Four times a day (QID) | ORAL | 0 refills | Status: AC | PRN

## 2024-02-22 MED ORDER — BENAZEPRIL HCL 5 MG PO TABS: 5.0000 mg | ORAL_TABLET | Freq: Every day | ORAL | 3 refills | Status: AC

## 2024-02-22 MED ORDER — ASPIRIN 81 MG PO CHEW: 81.0000 mg | CHEWABLE_TABLET | Freq: Every day | ORAL | 0 refills | Status: AC

## 2024-02-22 MED ORDER — FLUTICASONE PROPIONATE 50 MCG/ACT NA SUSP: 2.0000 | Freq: Every day | NASAL | 0 refills | Status: AC

## 2024-02-22 MED ORDER — HYDROCHLOROTHIAZIDE 25 MG PO TABS: 25.0000 mg | ORAL_TABLET | Freq: Every day | ORAL | 3 refills | Status: AC

## 2024-02-22 MED ORDER — CEPHALEXIN 500 MG PO CAPS: 1000.0000 mg | ORAL_CAPSULE | Freq: Two times a day (BID) | ORAL | 0 refills | Status: AC

## 2024-02-22 MED ORDER — ALBUTEROL SULFATE HFA 108 (90 BASE) MCG/ACT IN AERS: 2.0000 | INHALATION_SPRAY | RESPIRATORY_TRACT | 0 refills | Status: AC | PRN

## 2024-02-22 MED ORDER — ROSUVASTATIN CALCIUM 10 MG PO TABS: 10.0000 mg | ORAL_TABLET | Freq: Every day | ORAL | 0 refills | Status: AC

## 2024-02-22 MED ORDER — AMLODIPINE BESYLATE 5 MG PO TABS: 5.0000 mg | ORAL_TABLET | Freq: Every day | ORAL | 3 refills | Status: AC

## 2024-02-22 MED ORDER — METFORMIN HCL 500 MG PO TABS: 500.0000 mg | ORAL_TABLET | Freq: Two times a day (BID) | ORAL | 0 refills | Status: AC

## 2024-02-22 MED ORDER — PANTOPRAZOLE SODIUM 20 MG PO TBEC: 20.0000 mg | DELAYED_RELEASE_TABLET | Freq: Every day | ORAL | 0 refills | Status: AC

## 2024-02-22 MED ORDER — DONEPEZIL HCL 10 MG PO TABS: 10.0000 mg | ORAL_TABLET | Freq: Every day | ORAL | 0 refills | Status: AC

## 2024-02-22 NOTE — Progress Notes (Addendum)
 3:10pm: Patient can go to [facility], room 501A via PTAR - RN to call when ready. The number to call for report is [phone].   CSW spoke with patient's daughter to inform her of discharge plan and she is agreeable.   2:58pm: Patient insurance authorization has been approved #[id number], next review date is 02/24/24.  7:40am: Patient's insurance authorization remains pending at this time.  Shepard Dicker, MSW, LCSW Transitions of Care  Clinical Social Worker II [phone]

## 2024-02-22 NOTE — Discharge Instructions (Signed)
 Follow up with your doctor in the office.

## 2024-02-22 NOTE — ED Notes (Signed)
[facility] called pt is number 11 on the list

## 2024-03-10 ENCOUNTER — Other Ambulatory Visit: Payer: Self-pay | Admitting: Neurology

## 2024-03-23 ENCOUNTER — Ambulatory Visit: Payer: Medicare (Managed Care) | Admitting: Primary Care

## 2024-05-29 ENCOUNTER — Ambulatory Visit: Payer: Medicare (Managed Care) | Admitting: Neurology

## 7039-07-05 DEATH — deceased
